# Patient Record
Sex: Male | Born: 1955 | Race: White | Hispanic: No | Marital: Married | Smoking: Current every day smoker
Health system: Southern US, Community
[De-identification: ages and names within clinical notes are randomized; demographics above are authoritative.]

## PROBLEM LIST (undated history)

## (undated) DIAGNOSIS — J45909 Unspecified asthma, uncomplicated: Secondary | ICD-10-CM

## (undated) DIAGNOSIS — I10 Essential (primary) hypertension: Secondary | ICD-10-CM

## (undated) DIAGNOSIS — J449 Chronic obstructive pulmonary disease, unspecified: Secondary | ICD-10-CM

---

## 2012-12-25 ENCOUNTER — Encounter (HOSPITAL_COMMUNITY): Payer: Self-pay | Admitting: *Deleted

## 2012-12-25 ENCOUNTER — Emergency Department (HOSPITAL_COMMUNITY): Payer: PRIVATE HEALTH INSURANCE

## 2012-12-25 ENCOUNTER — Emergency Department (HOSPITAL_COMMUNITY)
Admission: EM | Admit: 2012-12-25 | Discharge: 2012-12-25 | Disposition: A | Discharge status: Home / Self Care | Payer: PRIVATE HEALTH INSURANCE | Attending: Emergency Medicine | Admitting: Emergency Medicine

## 2012-12-25 DIAGNOSIS — R0682 Tachypnea, not elsewhere classified: Secondary | ICD-10-CM | POA: Insufficient documentation

## 2012-12-25 DIAGNOSIS — J45901 Unspecified asthma with (acute) exacerbation: Secondary | ICD-10-CM | POA: Insufficient documentation

## 2012-12-25 DIAGNOSIS — F17200 Nicotine dependence, unspecified, uncomplicated: Secondary | ICD-10-CM | POA: Insufficient documentation

## 2012-12-25 DIAGNOSIS — J4489 Other specified chronic obstructive pulmonary disease: Secondary | ICD-10-CM | POA: Insufficient documentation

## 2012-12-25 DIAGNOSIS — J449 Chronic obstructive pulmonary disease, unspecified: Secondary | ICD-10-CM | POA: Insufficient documentation

## 2012-12-25 DIAGNOSIS — J4521 Mild intermittent asthma with (acute) exacerbation: Secondary | ICD-10-CM

## 2012-12-25 DIAGNOSIS — I10 Essential (primary) hypertension: Secondary | ICD-10-CM | POA: Insufficient documentation

## 2012-12-25 DIAGNOSIS — R062 Wheezing: Secondary | ICD-10-CM | POA: Insufficient documentation

## 2012-12-25 HISTORY — DX: Essential (primary) hypertension: I10

## 2012-12-25 HISTORY — DX: Unspecified asthma, uncomplicated: J45.909

## 2012-12-25 HISTORY — DX: Chronic obstructive pulmonary disease, unspecified: J44.9

## 2012-12-25 LAB — TROPONIN I: Troponin I: <0.3 ng/mL (ref <0.30)

## 2012-12-25 LAB — BLOOD GAS, ARTERIAL
Acid-Base Excess: 1.4 mmol/L (ref 0.0–2.0)
Drawn by: 340271
FIO2: 0.28 %
pCO2 arterial: 40.8 mmHg (ref 35.0–45.0)
pH, Arterial: 7.413 (ref 7.350–7.450)
pO2, Arterial: 77.2 mmHg — ABNORMAL LOW (ref 80.0–100.0)

## 2012-12-25 LAB — BASIC METABOLIC PANEL
CO2: 25 mEq/L (ref 19–32)
Calcium: 9.3 mg/dL (ref 8.4–10.5)
Glucose, Bld: 103 mg/dL — ABNORMAL HIGH (ref 70–99)
Sodium: 136 mEq/L (ref 135–145)

## 2012-12-25 LAB — CBC
Hemoglobin: 15.7 g/dL (ref 13.0–17.0)
MCH: 31.8 pg (ref 26.0–34.0)
RBC: 4.93 MIL/uL (ref 4.22–5.81)

## 2012-12-25 MED ORDER — ALBUTEROL SULFATE HFA 108 (90 BASE) MCG/ACT IN AERS
2.0000 | INHALATION_SPRAY | RESPIRATORY_TRACT | Status: DC
  Administered 2012-12-25: 2 via RESPIRATORY_TRACT
  Filled 2012-12-25: qty 6.7

## 2012-12-25 MED ORDER — ALBUTEROL (5 MG/ML) CONTINUOUS INHALATION SOLN: 15.0000 mg | INHALATION_SOLUTION | Freq: Once | RESPIRATORY_TRACT | Status: AC

## 2012-12-25 MED ORDER — METHYLPREDNISOLONE SODIUM SUCC 125 MG IJ SOLR
125.0000 mg | Freq: Once | INTRAMUSCULAR | Status: AC
  Administered 2012-12-25: 125 mg via INTRAVENOUS
  Filled 2012-12-25: qty 2

## 2012-12-25 MED ORDER — ALBUTEROL SULFATE (5 MG/ML) 0.5% IN NEBU
INHALATION_SOLUTION | RESPIRATORY_TRACT | Status: AC
  Administered 2012-12-25: 5 mg via RESPIRATORY_TRACT
  Filled 2012-12-25: qty 1

## 2012-12-25 MED ORDER — IPRATROPIUM BROMIDE 0.02 % IN SOLN
0.5000 mg | Freq: Once | RESPIRATORY_TRACT | Status: AC
  Administered 2012-12-25: 0.5 mg via RESPIRATORY_TRACT

## 2012-12-25 MED ORDER — IPRATROPIUM BROMIDE 0.02 % IN SOLN
RESPIRATORY_TRACT | Status: AC
  Filled 2012-12-25: qty 2.5

## 2012-12-25 MED ORDER — ALBUTEROL SULFATE HFA 108 (90 BASE) MCG/ACT IN AERS: 2.0000 | INHALATION_SPRAY | RESPIRATORY_TRACT | Status: DC | PRN

## 2012-12-25 MED ORDER — PREDNISONE 10 MG PO TABS: 60.0000 mg | ORAL_TABLET | Freq: Every day | ORAL | Status: DC

## 2012-12-25 NOTE — ED Notes (Signed)
Pt states he has been breathing like this for an hour,  , incomplete sentences,  Pt states he is asthmatic and he hasn't been following up with MD and has fevers and out of inhaler

## 2012-12-25 NOTE — ED Notes (Signed)
Per wife at bedside, she states pt has had a change in mental status and has been vomiting this morning

## 2012-12-25 NOTE — ED Provider Notes (Signed)
CSN: [id number]     Arrival date & time 12/25/12  6213 History     First MD Initiated Contact with Patient 12/25/12 0710     Chief Complaint  Patient presents with  . Shortness of Breath    HPI Patient reports long-standing history of asthma.  He continues to smoke cigarettes.  He reports worsening shortness of breath her the past 24 hours with cough.  Denies fevers and chills.  No chest pain or chest tightness.  No abdominal pain.  No nausea vomiting or diarrhea.  Symptoms at this time are moderate to severe in severity.  He states he ran out of his albuterol and therefore his been unable to take any medications at home.  He is not currently on steroids.  He recently relocated to this area from [place].  He has no primary care physician in the area.  Nothing worsens the symptoms.  Nothing improves his symptoms.    Past Medical History  Diagnosis Date  . Asthma   . COPD (chronic obstructive pulmonary disease)   . Hypertension    No past surgical history on file. History reviewed. No pertinent family history. History  Substance Use Topics  . Smoking status: Heavy Tobacco Smoker -- 1.00 packs/day  . Smokeless tobacco: Not on file  . Alcohol Use: Yes     Comment: occassional    Review of Systems  All other systems reviewed and are negative.    Allergies  Review of patient's allergies indicates no known allergies.  Home Medications  No current outpatient prescriptions on file. BP 157/113  Pulse 74  Resp 24  SpO2 94% Physical Exam  Nursing note and vitals reviewed. Constitutional: He is oriented to person, place, and time. He appears well-developed and well-nourished.  HENT:  Head: Normocephalic and atraumatic.  Eyes: EOM are normal.  Neck: Normal range of motion.  Cardiovascular: Normal rate, regular rhythm, normal heart sounds and intact distal pulses.   Pulmonary/Chest: Tachypnea noted. No respiratory distress. He has wheezes.  Abdominal: Soft. He exhibits no  distension. There is no tenderness.  Genitourinary: Rectum normal.  Musculoskeletal: Normal range of motion. He exhibits no edema.  Neurological: He is alert and oriented to person, place, and time.  Skin: Skin is warm and dry.  Psychiatric: He has a normal mood and affect. Judgment normal.    ED Course   Procedures (including critical care time)  Labs Reviewed  BASIC METABOLIC PANEL - Abnormal; Notable for the following:    Glucose, Bld 103 (*)    All other components within normal limits  PRO B NATRIURETIC PEPTIDE - Abnormal; Notable for the following:    Pro B Natriuretic peptide (BNP) 151.2 (*)    All other components within normal limits  BLOOD GAS, ARTERIAL - Abnormal; Notable for the following:    pO2, Arterial 77.2 (*)    Bicarbonate 25.5 (*)    All other components within normal limits  CBC  TROPONIN I   Dg Chest Portable 1 View  12/25/2012   *RADIOLOGY REPORT*  Clinical Data: Smoker with current history of COPD presenting with acute shortness of breath.  PORTABLE CHEST - 1 VIEW 12/25/2012 0723 hours:  Comparison: None.  Findings: Cardiac silhouette upper normal in size for AP portable technique.  Thoracic aorta mildly tortuous and atherosclerotic. Hilar and mediastinal contours otherwise unremarkable. Emphysematous changes in the upper lobes with vascular redistribution to the mid and lower lungs.  Mild central peribronchial thickening.  No confluent airspace consolidation.  No  visible pleural effusions.  IMPRESSION: COPD/emphysema.  No acute cardiopulmonary disease.   Original Report Authenticated By: Hulan Saas, M.D.   I personally reviewed the imaging tests through PACS system I reviewed available ER/hospitalization records through the EMR   1. Asthma exacerbation attacks, mild intermittent     MDM  Asthma exacerbation.  Significant wheezing and some tachypnea at this time.  Will start on BiPAP and on a continuous albuterol treatment.  ABG pending.   8:15  AM No wheezing now. Feels much better. Home with steroids and albuterol for asthma exacerbation.  He made a very rapid improvement and therefore think that the majority of this is secondary to noncompliance with his albuterol and his continued smoking   Lyanne Co, MD 12/25/12 [phone]

## 2013-03-11 ENCOUNTER — Encounter (HOSPITAL_COMMUNITY): Payer: Self-pay | Admitting: Emergency Medicine

## 2013-03-11 ENCOUNTER — Emergency Department (HOSPITAL_COMMUNITY): Payer: PRIVATE HEALTH INSURANCE

## 2013-03-11 ENCOUNTER — Inpatient Hospital Stay (HOSPITAL_COMMUNITY)
Admission: EM | Admit: 2013-03-11 | Discharge: 2013-03-13 | DRG: 189 | Disposition: A | Discharge status: Home / Self Care | Payer: PRIVATE HEALTH INSURANCE | Attending: Internal Medicine | Admitting: Internal Medicine

## 2013-03-11 DIAGNOSIS — D72829 Elevated white blood cell count, unspecified: Secondary | ICD-10-CM | POA: Diagnosis present

## 2013-03-11 DIAGNOSIS — Z8249 Family history of ischemic heart disease and other diseases of the circulatory system: Secondary | ICD-10-CM

## 2013-03-11 DIAGNOSIS — F17200 Nicotine dependence, unspecified, uncomplicated: Secondary | ICD-10-CM | POA: Diagnosis present

## 2013-03-11 DIAGNOSIS — J441 Chronic obstructive pulmonary disease with (acute) exacerbation: Secondary | ICD-10-CM | POA: Diagnosis present

## 2013-03-11 DIAGNOSIS — I959 Hypotension, unspecified: Secondary | ICD-10-CM

## 2013-03-11 DIAGNOSIS — F17213 Nicotine dependence, cigarettes, with withdrawal: Secondary | ICD-10-CM

## 2013-03-11 DIAGNOSIS — J9600 Acute respiratory failure, unspecified whether with hypoxia or hypercapnia: Principal | ICD-10-CM | POA: Diagnosis present

## 2013-03-11 DIAGNOSIS — I10 Essential (primary) hypertension: Secondary | ICD-10-CM | POA: Diagnosis present

## 2013-03-11 DIAGNOSIS — E871 Hypo-osmolality and hyponatremia: Secondary | ICD-10-CM

## 2013-03-11 DIAGNOSIS — R509 Fever, unspecified: Secondary | ICD-10-CM | POA: Diagnosis present

## 2013-03-11 LAB — CBC WITH DIFFERENTIAL/PLATELET
Basophils Absolute: 0 10*3/uL (ref 0.0–0.1)
Basophils Relative: 0 % (ref 0–1)
Lymphocytes Relative: 10 % — ABNORMAL LOW (ref 12–46)
Neutro Abs: 12.2 10*3/uL — ABNORMAL HIGH (ref 1.7–7.7)
Neutrophils Relative %: 78 % — ABNORMAL HIGH (ref 43–77)
Platelets: 275 10*3/uL (ref 150–400)
RDW: 12.5 % (ref 11.5–15.5)
WBC: 15.5 10*3/uL — ABNORMAL HIGH (ref 4.0–10.5)

## 2013-03-11 LAB — BLOOD GAS, ARTERIAL
O2 Content: 3 L/min
O2 Saturation: 89.1 %
Patient temperature: 98.6
pCO2 arterial: 38.9 mmHg (ref 35.0–45.0)
pH, Arterial: 7.414 (ref 7.350–7.450)

## 2013-03-11 LAB — COMPREHENSIVE METABOLIC PANEL
ALT: 12 U/L (ref 0–53)
AST: 15 U/L (ref 0–37)
Albumin: 3.8 g/dL (ref 3.5–5.2)
CO2: 24 mEq/L (ref 19–32)
Calcium: 9.2 mg/dL (ref 8.4–10.5)
Chloride: 100 mEq/L (ref 96–112)
GFR calc non Af Amer: >90 mL/min (ref >90)
Sodium: 134 mEq/L — ABNORMAL LOW (ref 135–145)
Total Bilirubin: 0.7 mg/dL (ref 0.3–1.2)

## 2013-03-11 LAB — POCT I-STAT TROPONIN I: Troponin i, poc: 0 ng/mL (ref 0.00–0.08)

## 2013-03-11 MED ORDER — ALBUTEROL SULFATE (5 MG/ML) 0.5% IN NEBU
2.5000 mg | INHALATION_SOLUTION | RESPIRATORY_TRACT | Status: DC
  Administered 2013-03-11 – 2013-03-13 (×10): 2.5 mg via RESPIRATORY_TRACT
  Filled 2013-03-11 (×10): qty 0.5

## 2013-03-11 MED ORDER — IPRATROPIUM BROMIDE 0.02 % IN SOLN
0.5000 mg | RESPIRATORY_TRACT | Status: DC
  Administered 2013-03-11 – 2013-03-13 (×10): 0.5 mg via RESPIRATORY_TRACT
  Filled 2013-03-11 (×10): qty 2.5

## 2013-03-11 MED ORDER — ACETAMINOPHEN 650 MG RE SUPP: 650.0000 mg | Freq: Four times a day (QID) | RECTAL | Status: DC | PRN

## 2013-03-11 MED ORDER — ALBUTEROL SULFATE (5 MG/ML) 0.5% IN NEBU
INHALATION_SOLUTION | RESPIRATORY_TRACT | Status: AC
  Filled 2013-03-11: qty 0.5

## 2013-03-11 MED ORDER — HYDROCODONE-ACETAMINOPHEN 5-325 MG PO TABS: 1.0000 | ORAL_TABLET | ORAL | Status: DC | PRN

## 2013-03-11 MED ORDER — METHYLPREDNISOLONE SODIUM SUCC 125 MG IJ SOLR
80.0000 mg | Freq: Four times a day (QID) | INTRAMUSCULAR | Status: DC
  Administered 2013-03-11 – 2013-03-12 (×2): 80 mg via INTRAVENOUS
  Filled 2013-03-11: qty 1.28
  Filled 2013-03-11 (×2): qty 2
  Filled 2013-03-11 (×5): qty 1.28

## 2013-03-11 MED ORDER — SODIUM CHLORIDE 0.9 % IV SOLN
INTRAVENOUS | Status: DC
  Administered 2013-03-11: 23:00:00 via INTRAVENOUS

## 2013-03-11 MED ORDER — DEXTROSE 5 % IV SOLN
500.0000 mg | Freq: Once | INTRAVENOUS | Status: AC
  Administered 2013-03-11: 500 mg via INTRAVENOUS
  Filled 2013-03-11: qty 500

## 2013-03-11 MED ORDER — ONDANSETRON HCL 4 MG/2ML IJ SOLN
4.0000 mg | Freq: Once | INTRAMUSCULAR | Status: AC
  Administered 2013-03-11: 4 mg via INTRAVENOUS
  Filled 2013-03-11: qty 2

## 2013-03-11 MED ORDER — ENOXAPARIN SODIUM 40 MG/0.4ML ~~LOC~~ SOLN
40.0000 mg | SUBCUTANEOUS | Status: DC
Start: 2013-03-11 — End: 2013-03-12
  Administered 2013-03-11: 40 mg via SUBCUTANEOUS
  Filled 2013-03-11 (×2): qty 0.4

## 2013-03-11 MED ORDER — IPRATROPIUM BROMIDE 0.02 % IN SOLN
0.5000 mg | Freq: Once | RESPIRATORY_TRACT | Status: AC
  Administered 2013-03-11: 0.5 mg via RESPIRATORY_TRACT
  Filled 2013-03-11: qty 2.5

## 2013-03-11 MED ORDER — MORPHINE SULFATE 2 MG/ML IJ SOLN: 1.0000 mg | INTRAMUSCULAR | Status: DC | PRN

## 2013-03-11 MED ORDER — ACETAMINOPHEN 325 MG PO TABS: 650.0000 mg | ORAL_TABLET | Freq: Four times a day (QID) | ORAL | Status: DC | PRN

## 2013-03-11 MED ORDER — ALBUTEROL SULFATE (5 MG/ML) 0.5% IN NEBU
2.5000 mg | INHALATION_SOLUTION | Freq: Once | RESPIRATORY_TRACT | Status: AC
  Administered 2013-03-11: 2.5 mg via RESPIRATORY_TRACT
  Filled 2013-03-11: qty 0.5

## 2013-03-11 MED ORDER — IPRATROPIUM BROMIDE 0.02 % IN SOLN
0.5000 mg | Freq: Once | RESPIRATORY_TRACT | Status: AC
  Administered 2013-03-11: 0.5 mg via RESPIRATORY_TRACT

## 2013-03-11 MED ORDER — ALBUTEROL SULFATE (5 MG/ML) 0.5% IN NEBU: 2.5000 mg | INHALATION_SOLUTION | RESPIRATORY_TRACT | Status: DC

## 2013-03-11 MED ORDER — DEXTROSE 5 % IV SOLN
1.0000 g | Freq: Once | INTRAVENOUS | Status: AC
  Administered 2013-03-11: 1 g via INTRAVENOUS
  Filled 2013-03-11: qty 10

## 2013-03-11 MED ORDER — LEVOFLOXACIN IN D5W 500 MG/100ML IV SOLN
500.0000 mg | INTRAVENOUS | Status: DC
  Administered 2013-03-12 – 2013-03-13 (×2): 500 mg via INTRAVENOUS
  Filled 2013-03-11 (×4): qty 100

## 2013-03-11 MED ORDER — ALBUTEROL SULFATE (5 MG/ML) 0.5% IN NEBU: 2.5000 mg | INHALATION_SOLUTION | RESPIRATORY_TRACT | Status: DC | PRN

## 2013-03-11 MED ORDER — ALBUTEROL SULFATE (5 MG/ML) 0.5% IN NEBU
2.5000 mg | INHALATION_SOLUTION | Freq: Once | RESPIRATORY_TRACT | Status: AC
  Administered 2013-03-11: 2.5 mg via RESPIRATORY_TRACT

## 2013-03-11 MED ORDER — GUAIFENESIN ER 600 MG PO TB12
600.0000 mg | ORAL_TABLET | Freq: Two times a day (BID) | ORAL | Status: DC
  Administered 2013-03-11 – 2013-03-13 (×4): 600 mg via ORAL
  Filled 2013-03-11 (×5): qty 1

## 2013-03-11 MED ORDER — IOHEXOL 300 MG/ML  SOLN
80.0000 mL | Freq: Once | INTRAMUSCULAR | Status: AC | PRN
  Administered 2013-03-11: 80 mL via INTRAVENOUS

## 2013-03-11 MED ORDER — TRAMADOL HCL 50 MG PO TABS
50.0000 mg | ORAL_TABLET | Freq: Once | ORAL | Status: AC
  Administered 2013-03-11: 50 mg via ORAL
  Filled 2013-03-11: qty 1

## 2013-03-11 NOTE — ED Notes (Signed)
RT called for abg.

## 2013-03-11 NOTE — Progress Notes (Signed)
Patient confirms he does not have a pcp.  Patient reports he has just moved to [place] from [place].  His pcp in [place] is Dr. Lucy Chris.  Patient is currently looking for a pcp.  Instructed patient to call the phone number on the back of his insurance card to help him find a physician who is close to him and within network.  Patient verbalized understanding.

## 2013-03-11 NOTE — ED Provider Notes (Signed)
CSN: [id number]     Arrival date & time 03/11/13  1608 History   First MD Initiated Contact with Patient 03/11/13 1621     Chief Complaint  Patient presents with  . Chest Pain  . 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  He reports that this "knocked the breath out of me" but did not loose consciousness.  He reports pain with now increase in shortness of breath.  Reports that he has a history of COPD but states that over the past several days his shortness of breath is worse.  Reports productive cough with while sputum.  States fever to 104 today and generalized body aches.  States moved here from [place] 6 months ago and has not seen a PCP here or pulmonologist.  Reports radiation of the pain to his back.    Patient is a 57 y.o. male presenting with chest pain. The history is provided by the patient and the spouse. No language interpreter was used.  Chest Pain Pain location:  L lateral chest Pain quality: dull and radiating   Pain radiates to:  Upper back Pain radiates to the back: yes   Pain severity:  Moderate Onset quality:  Gradual Duration:  7 days Timing:  Constant Progression:  Worsening Chronicity:  New Context: breathing, movement and trauma   Relieved by:  Nothing Worsened by:  Nothing tried Ineffective treatments:  None tried Associated symptoms: back pain, cough, fever and shortness of breath   Associated symptoms: no abdominal pain, no anorexia, no dizziness, no dysphagia, no headache, no lower extremity edema, no nausea, no numbness, no palpitations, no syncope, not vomiting and no weakness   Risk factors: hypertension and smoking     Past Medical History  Diagnosis Date  . Asthma   . COPD (chronic obstructive pulmonary disease)   . Hypertension      History reviewed. No pertinent past surgical history. No family history on file. History  Substance Use Topics  . Smoking status: Heavy Tobacco Smoker -- 1.00 packs/day  . Smokeless tobacco: Not on file  . Alcohol Use: Yes     Comment: occassional    Review of Systems  Constitutional: Positive for fever.  HENT: Negative for trouble swallowing.   Respiratory: Positive for cough and shortness of breath.   Cardiovascular: Positive for chest pain. Negative for palpitations and syncope.  Gastrointestinal: Negative for nausea, vomiting, abdominal pain and anorexia.  Musculoskeletal: Positive for back pain.  Neurological: Negative for dizziness, weakness, numbness and headaches.  All other systems reviewed and are negative.    Allergies  Review of patient's allergies indicates no known allergies.  Home Medications   Current Outpatient Rx  Name  Route  Sig  Dispense  Refill  . acetaminophen (TYLENOL) 500 MG tablet   Oral   Take 500 mg by mouth every 6 (six) hours as needed for pain (pain).         Marland Kitchen albuterol (PROVENTIL HFA;VENTOLIN HFA) 108 (90 BASE) MCG/ACT inhaler   Inhalation   Inhale 2 puffs into the lungs every 4 (four) hours as needed for wheezing.   1 Inhaler   0   . albuterol (PROVENTIL HFA;VENTOLIN HFA) 108 (90 BASE) MCG/ACT inhaler   Inhalation   Inhale 2 puffs into the lungs every 6 (six) hours as needed for  wheezing (pt uses wife inhaler).         . predniSONE (DELTASONE) 10 MG tablet   Oral   Take 6 tablets (60 mg total) by mouth daily.   30 tablet   0    BP 131/65  Pulse 105  Temp(Src) 98.7 F (37.1 C) (Oral)  Resp 24  SpO2 90% Physical Exam  Nursing note and vitals reviewed. Constitutional: He is oriented to person, place, and time. He appears well-developed and well-nourished.  febrile  HENT:  Head: Normocephalic and atraumatic.  Right Ear: External ear normal.  Left Ear: External ear normal.  Nose: Nose normal.  Mouth/Throat:  Oropharynx is clear and moist. No oropharyngeal exudate.  Eyes: Conjunctivae are normal. Pupils are equal, round, and reactive to light. No scleral icterus.  Neck: Normal range of motion. Neck supple.  Cardiovascular: Normal rate, regular rhythm, normal heart sounds and intact distal pulses.  Exam reveals no gallop and no friction rub.   No murmur heard. Pulmonary/Chest: Tachypnea noted. No respiratory distress. He has no decreased breath sounds. He has wheezes in the right lower field, the left middle field and the left lower field. He has rales in the right lower field, the left middle field and the left lower field. He exhibits tenderness.  Abdominal: Soft. Bowel sounds are normal. He exhibits no distension. There is no tenderness.  Musculoskeletal: Normal range of motion. He exhibits no edema.       Thoracic back: He exhibits tenderness. He exhibits no bony tenderness.       Back:  Lymphadenopathy:    He has no cervical adenopathy.  Neurological: He is alert and oriented to person, place, and time. No cranial nerve deficit. He exhibits normal muscle tone. Coordination normal.  Skin: Skin is warm and dry. No rash noted. No erythema. No pallor.  Psychiatric: He has a normal mood and affect. His behavior is normal. Judgment and thought content normal.    ED Course  Procedures (including critical care time) Labs Review Labs Reviewed  CBC WITH DIFFERENTIAL  COMPREHENSIVE METABOLIC PANEL   Imaging Review No results found.  EKG Interpretation   None      Date: 03/11/2013  Rate: 100  Rhythm: sinus tachycardia  QRS Axis: normal  Intervals: left atrial enlargement  ST/T Wave abnormalities: normal  Conduction Disutrbances:none  Narrative Interpretation: Reviewed by Dr. Effie Shy  Old EKG Reviewed: None available  Results for orders placed during the hospital encounter of 03/11/13  CBC WITH DIFFERENTIAL      Result Value Range   WBC 15.5 (*) 4.0 - 10.5 K/uL   RBC 4.48  4.22 - 5.81  MIL/uL   Hemoglobin 14.3  13.0 - 17.0 g/dL   HCT 16.1  09.6 - 04.5 %   MCV 90.4  78.0 - 100.0 fL   MCH 31.9  26.0 - 34.0 pg   MCHC 35.3  30.0 - 36.0 g/dL   RDW 40.9  81.1 - 91.4 %   Platelets 275  150 - 400 K/uL   Neutrophils Relative % 78 (*) 43 - 77 %   Neutro Abs 12.2 (*) 1.7 - 7.7 K/uL   Lymphocytes Relative 10 (*) 12 - 46 %   Lymphs Abs 1.5  0.7 - 4.0 K/uL   Monocytes Relative 9  3 - 12 %   Monocytes Absolute 1.4 (*) 0.1 - 1.0 K/uL   Eosinophils Relative 3  0 - 5 %   Eosinophils Absolute 0.4  0.0 - 0.7 K/uL  Basophils Relative 0  0 - 1 %   Basophils Absolute 0.0  0.0 - 0.1 K/uL  COMPREHENSIVE METABOLIC PANEL      Result Value Range   Sodium 134 (*) 135 - 145 mEq/L   Potassium 3.8  3.5 - 5.1 mEq/L   Chloride 100  96 - 112 mEq/L   CO2 24  19 - 32 mEq/L   Glucose, Bld 118 (*) 70 - 99 mg/dL   BUN 13  6 - 23 mg/dL   Creatinine, Ser 4.09  0.50 - 1.35 mg/dL   Calcium 9.2  8.4 - 81.1 mg/dL   Total Protein 7.0  6.0 - 8.3 g/dL   Albumin 3.8  3.5 - 5.2 g/dL   AST 15  0 - 37 U/L   ALT 12  0 - 53 U/L   Alkaline Phosphatase 92  39 - 117 U/L   Total Bilirubin 0.7  0.3 - 1.2 mg/dL   GFR calc non Af Amer >90  >90 mL/min   GFR calc Af Amer >90  >90 mL/min  POCT I-STAT TROPONIN I      Result Value Range   Troponin i, poc 0.00  0.00 - 0.08 ng/mL   Comment 3            Dg Chest 2 View  03/11/2013   CLINICAL DATA:  Chest pain. COPD.  EXAM: CHEST  2 VIEW  COMPARISON:  CHEST x-ray 12/25/2012.  FINDINGS: Mild diffuse peribronchial cuffing, similar to the prior study. Lung volumes are normal. No consolidative airspace disease. No pleural effusions. No pneumothorax. No pulmonary nodule or mass noted. Pulmonary vasculature and the cardiomediastinal silhouette are within normal limits.  IMPRESSION: * No radiographic evidence of acute cardiopulmonary disease. *Mild diffuse peribronchial cuffing may indicate mild chronic bronchitis, and is similar to prior to prior study.   Electronically Signed    By: Trudie Reed M.D.   On: 03/11/2013 18:05   Ct Chest W Contrast  03/11/2013   CLINICAL DATA:  History of trauma after falling off a truck. Shortness of breath and intermittent chest pain.  EXAM: CT CHEST WITH CONTRAST  TECHNIQUE: Multidetector CT imaging of the chest was performed during intravenous contrast administration.  CONTRAST:  80mL OMNIPAQUE IOHEXOL 300 MG/ML  SOLN  COMPARISON:  No priors.  FINDINGS: Mediastinum: Heart size is normal. There is no significant pericardial fluid, thickening or pericardial calcification. There is atherosclerosis of the thoracic aorta, the great vessels of the mediastinum and the coronary arteries, including calcified atherosclerotic plaque in the left main, left anterior descending, left circumflex and right coronary arteries. Numerous borderline enlarged mediastinal and bilateral hilar lymph nodes are nonspecific and may be reactive. No definite pathologically enlarged lymph nodes are identified. Esophagus is unremarkable in appearance.  Lungs/Pleura: Widespread bronchial wall thickening is noted. There is some associated thickening of the peribronchovascular interstitium throughout the lung bases bilaterally with some very tiny peribronchovascular micronodularity in the lower lobes. No confluent consolidative airspace disease. No pleural effusions. Mild centrilobular emphysema. No definite large suspicious appearing pulmonary nodules or masses are identified. Evaluation of the lung bases is significantly limited by a large amount of respiratory motion.  Upper Abdomen: Unremarkable.  Musculoskeletal: No acute displaced fractures in the visualized portions of the skeleton. There are no aggressive appearing lytic or blastic lesions noted in the visualized portions of the skeleton.  IMPRESSION: 1. No evidence to suggest significant acute traumatic injury to the thorax. The appearance of the chest suggests bronchitis, as above. Although  some of this may be chronic,  particularly based on comparison with prior chest x-rays, the appearance on today's examination suggests an element of acute bronchitis as well. 2. Atherosclerosis, including left main and 3 vessel coronary artery disease. Please note that although the presence of coronary artery calcium documents the presence of coronary artery disease, the severity of this disease and any potential stenosis cannot be assessed on this non-gated CT examination. Assessment for potential risk factor modification, dietary therapy or pharmacologic therapy may be warranted, if clinically indicated. 3. Borderline enlarged mediastinal lymph nodes are presumably reactive in the setting of underlying infection.   Electronically Signed   By: Trudie Reed M.D.   On: 03/11/2013 20:02      MDM  COPD exacerbation  Patient is here with intermittent chest pain and shortness of breath s/p fall, though this likely started the pain, I do not suspect this to be cardiac or trauma in nature.  He has a history of COPD but is not on oxygen, here with fever and hypoxia to 88% on room air.  There is no evidence of PNA on x-ray or CT Scan but clinically I suspect there to be an infectious component.  I have started the patient on rocephin and zithromax.  He remains stable on 2 liters of oxygen with sats at 94%.  He has had two breathing treatments which have opened him up more now with bilateral expiratory wheezing noted.  I have spoken with Dr. Ranae Palms and we will admit him to tele.     Izola Price Marisue Humble, PA-C 03/11/13 2050

## 2013-03-11 NOTE — H&P (Signed)
[facility] History and Physical  Roger Fields ZOX:[id number] DOB: 10/07/1955 DOA: 03/11/2013  Referring physician: Dr Loma Messing.  PCP: No primary provider on file.  Specialists: none  Chief Complaint: worsening SOB.   HPI: Roger Fields is a 57 y.o. male with PMH significant for COPD, current smoker 1 PPD, Hypertension, he moved from [place] this year. He doesn't have a PCP. He presents to ED complaining of worsening SOB for last 3 to 4 days. Worsening cough. Reports fever at 104 at home. He has not notice improvement of SOB with nebulizer treatments. He fell off his truck and landed on his left lateral back days ago.  He denies chest pain at this time. He is breathing better.  He is sleepy but answer questions appropriately. He received tramadol in the ED. He relates he has not been able to sleep for last 3 days. ABG is pending.    Review of Systems: Negative except as per HPI.   Past Medical History  Diagnosis Date  . Asthma   . COPD (chronic obstructive pulmonary disease)   . Hypertension    History reviewed. No pertinent past surgical history. Social History:  reports that he has been smoking.  He does not have any smokeless tobacco history on file. He reports that he drinks alcohol. He reports that he does not use illicit drugs. smoke 1 pack per day. He works for [employer].    No Known Allergies  Family History: History of heart diseases and HTN.   Prior to Admission medications   Medication Sig Start Date End Date Taking? Authorizing Provider  albuterol (PROVENTIL HFA;VENTOLIN HFA) 108 (90 BASE) MCG/ACT inhaler Inhale 2 puffs into the lungs every 4 (four) hours as needed for wheezing. 12/25/12  Yes Lyanne Co, MD  Aspirin-Salicylamide-Caffeine [facility] HEADACHE POWDER PO) Take 1 each by mouth daily as needed (pain).   Yes Historical Provider, MD  ibuprofen (ADVIL,MOTRIN) 200 MG tablet Take 400 mg by mouth every 6 (six) hours as needed for pain.   Yes  Historical Provider, MD  lisinopril (PRINIVIL,ZESTRIL) 10 MG tablet Take 10 mg by mouth daily.   Yes Historical Provider, MD   Physical Exam: Filed Vitals:   03/11/13 1925  BP: 98/47  Pulse: 91  Temp: 97.9 F (36.6 C)  Resp: 18   General Appearance:    Sleepy, but wake up and answer questions,  cooperative, no distress, appears stated age  Head:    Normocephalic, without obvious abnormality, atraumatic  Eyes:    PERRL, conjunctiva/corneas clear, EOM's intact,         Ears:    Normal TM's and external ear canals, both ears  Nose:   Nares normal, septum midline, mucosa normal, no drainage    or sinus tenderness  Throat:   Lips, mucosa normal , and tongue with nodule old, ; teeth and gums normal  Neck:   Supple, symmetrical, trachea midline, no adenopathy;       thyroid:  No enlargement/tenderness/nodules; no carotid   bruit or JVD  Back:     Symmetric, no curvature, ROM normal, no CVA tenderness  Lungs:     Bilateral expiratory wheezes, respirations unlabored  Chest wall:    No tenderness or deformity  Heart:    Regular rate and rhythm, S1 and S2 normal, no murmur, rub   or gallop  Abdomen:     Soft, non-tender, bowel sounds active all four quadrants,    no masses, no organomegaly  Extremities:   Extremities normal, atraumatic, no cyanosis or edema  Pulses:   2+ and symmetric all extremities  Skin:   Skin color, texture, turgor normal, no rashes or lesions  Lymph nodes:   Cervical, supraclavicular, and axillary nodes normal  Neurologic:   CNII-XII intact. Normal strength, sensation and reflexes      throughout      Labs on Admission:  Basic Metabolic Panel:  Recent Labs Lab 03/11/13 1630  NA 134*  K 3.8  CL 100  CO2 24  GLUCOSE 118*  BUN 13  CREATININE 0.84  CALCIUM 9.2   Liver Function Tests:  Recent Labs Lab 03/11/13 1630  AST 15  ALT 12  ALKPHOS 92  BILITOT 0.7  PROT 7.0  ALBUMIN 3.8   No results found for this basename: LIPASE, AMYLASE,  in  the last 168 hours No results found for this basename: AMMONIA,  in the last 168 hours CBC:  Recent Labs Lab 03/11/13 1630  WBC 15.5*  NEUTROABS 12.2*  HGB 14.3  HCT 40.5  MCV 90.4  PLT 275   Cardiac Enzymes: No results found for this basename: CKTOTAL, CKMB, CKMBINDEX, TROPONINI,  in the last 168 hours  BNP (last 3 results)  Recent Labs  12/25/12 0715  PROBNP 151.2*   CBG: No results found for this basename: GLUCAP,  in the last 168 hours  Radiological Exams on Admission: Dg Chest 2 View  03/11/2013   CLINICAL DATA:  Chest pain. COPD.  EXAM: CHEST  2 VIEW  COMPARISON:  CHEST x-ray 12/25/2012.  FINDINGS: Mild diffuse peribronchial cuffing, similar to the prior study. Lung volumes are normal. No consolidative airspace disease. No pleural effusions. No pneumothorax. No pulmonary nodule or mass noted. Pulmonary vasculature and the cardiomediastinal silhouette are within normal limits.  IMPRESSION: * No radiographic evidence of acute cardiopulmonary disease. *Mild diffuse peribronchial cuffing may indicate mild chronic bronchitis, and is similar to prior to prior study.   Electronically Signed   By: Trudie Reed M.D.   On: 03/11/2013 18:05   Ct Chest W Contrast  03/11/2013   CLINICAL DATA:  History of trauma after falling off a truck. Shortness of breath and intermittent chest pain.  EXAM: CT CHEST WITH CONTRAST  TECHNIQUE: Multidetector CT imaging of the chest was performed during intravenous contrast administration.  CONTRAST:  80mL OMNIPAQUE IOHEXOL 300 MG/ML  SOLN  COMPARISON:  No priors.  FINDINGS: Mediastinum: Heart size is normal. There is no significant pericardial fluid, thickening or pericardial calcification. There is atherosclerosis of the thoracic aorta, the great vessels of the mediastinum and the coronary arteries, including calcified atherosclerotic plaque in the left main, left anterior descending, left circumflex and right coronary arteries. Numerous borderline  enlarged mediastinal and bilateral hilar lymph nodes are nonspecific and may be reactive. No definite pathologically enlarged lymph nodes are identified. Esophagus is unremarkable in appearance.  Lungs/Pleura: Widespread bronchial wall thickening is noted. There is some associated thickening of the peribronchovascular interstitium throughout the lung bases bilaterally with some very tiny peribronchovascular micronodularity in the lower lobes. No confluent consolidative airspace disease. No pleural effusions. Mild centrilobular emphysema. No definite large suspicious appearing pulmonary nodules or masses are identified. Evaluation of the lung bases is significantly limited by a large amount of respiratory motion.  Upper Abdomen: Unremarkable.  Musculoskeletal: No acute displaced fractures in the visualized portions of the skeleton. There are no aggressive appearing lytic or blastic lesions noted in the visualized portions of the skeleton.  IMPRESSION: 1. No  evidence to suggest significant acute traumatic injury to the thorax. The appearance of the chest suggests bronchitis, as above. Although some of this may be chronic, particularly based on comparison with prior chest x-rays, the appearance on today's examination suggests an element of acute bronchitis as well. 2. Atherosclerosis, including left main and 3 vessel coronary artery disease. Please note that although the presence of coronary artery calcium documents the presence of coronary artery disease, the severity of this disease and any potential stenosis cannot be assessed on this non-gated CT examination. Assessment for potential risk factor modification, dietary therapy or pharmacologic therapy may be warranted, if clinically indicated. 3. Borderline enlarged mediastinal lymph nodes are presumably reactive in the setting of underlying infection.   Electronically Signed   By: Trudie Reed M.D.   On: 03/11/2013 20:02    EKG: pending.    Assessment/Plan Active Problems:   COPD with exacerbation   Acute respiratory failure   Leukocytosis  1-Acute hypoxic respiratory failure: likely secondary to Acute COPD exacerbation in a current smoker patient. Patient sat 90 on 3 L. Will admit to step down units. I will start IV solumedrol. He received one dose of ceftriaxone and azithro. I will continue with Levaquin due to history of fever. ABG pending. Will order schedule albuterol and ipratropium.   2-Leukocytosis: with history of fever. I will continue with Levaquin. CT chest negative for PNA. Check UA. SBP soft ,will check lactic acid.   3-Hypertension: will hold lisinopril, SBP soft.   4-Borderline enlarged mediastinal lymph nodes are presumably  reactive in the setting of underlying infection.   Code Status: Full Code.  Family Communication: Care discussed with wife.  Disposition Plan: expect 2 to 3 days inpatient.   Time spent: 75 minutes.   Kahleel Fadeley [facility] Pager [phone]  If 7PM-7AM, please contact night-coverage [email or web address] Password [email or web address] 03/11/2013, 9:29 PM

## 2013-03-11 NOTE — ED Provider Notes (Signed)
Medical screening examination/treatment/procedure(s) were performed by non-physician practitioner and as supervising physician I was immediately available for consultation/collaboration.  Flint Melter, MD 03/11/13 2215

## 2013-03-11 NOTE — ED Notes (Signed)
Pt was at work and feel approx 5 ft off truck sob since then intermit chest pain, elveated bp hx of this and has been on medication for this. Hx copd.

## 2013-03-12 ENCOUNTER — Encounter (HOSPITAL_COMMUNITY): Payer: Self-pay | Admitting: *Deleted

## 2013-03-12 DIAGNOSIS — I959 Hypotension, unspecified: Secondary | ICD-10-CM

## 2013-03-12 DIAGNOSIS — I10 Essential (primary) hypertension: Secondary | ICD-10-CM

## 2013-03-12 DIAGNOSIS — E871 Hypo-osmolality and hyponatremia: Secondary | ICD-10-CM

## 2013-03-12 DIAGNOSIS — F17213 Nicotine dependence, cigarettes, with withdrawal: Secondary | ICD-10-CM

## 2013-03-12 LAB — BASIC METABOLIC PANEL
BUN: 13 mg/dL (ref 6–23)
Creatinine, Ser: 0.89 mg/dL (ref 0.50–1.35)
GFR calc Af Amer: >90 mL/min (ref >90)
GFR calc non Af Amer: >90 mL/min (ref >90)
Glucose, Bld: 164 mg/dL — ABNORMAL HIGH (ref 70–99)
Potassium: 4.1 mEq/L (ref 3.5–5.1)
Sodium: 133 mEq/L — ABNORMAL LOW (ref 135–145)

## 2013-03-12 LAB — CBC
Hemoglobin: 13.6 g/dL (ref 13.0–17.0)
MCH: 31.1 pg (ref 26.0–34.0)
MCHC: 34.2 g/dL (ref 30.0–36.0)
Platelets: 250 10*3/uL (ref 150–400)
RBC: 4.37 MIL/uL (ref 4.22–5.81)
RDW: 12.7 % (ref 11.5–15.5)

## 2013-03-12 LAB — URINALYSIS, ROUTINE W REFLEX MICROSCOPIC
Bilirubin Urine: NEGATIVE
Hgb urine dipstick: NEGATIVE
Ketones, ur: NEGATIVE mg/dL
Nitrite: NEGATIVE
Protein, ur: NEGATIVE mg/dL
Specific Gravity, Urine: 1.023 (ref 1.005–1.030)
Urobilinogen, UA: 1 mg/dL (ref 0.0–1.0)

## 2013-03-12 LAB — MRSA PCR SCREENING: MRSA by PCR: NEGATIVE

## 2013-03-12 MED ORDER — PNEUMOCOCCAL VAC POLYVALENT 25 MCG/0.5ML IJ INJ
0.5000 mL | INJECTION | INTRAMUSCULAR | Status: AC
  Administered 2013-03-13: 0.5 mL via INTRAMUSCULAR
  Filled 2013-03-12 (×2): qty 0.5

## 2013-03-12 MED ORDER — ENOXAPARIN SODIUM 60 MG/0.6ML ~~LOC~~ SOLN
50.0000 mg | SUBCUTANEOUS | Status: DC
  Administered 2013-03-12: 50 mg via SUBCUTANEOUS
  Filled 2013-03-12 (×2): qty 0.6

## 2013-03-12 MED ORDER — INFLUENZA VAC SPLIT QUAD 0.5 ML IM SUSP
0.5000 mL | INTRAMUSCULAR | Status: AC
  Administered 2013-03-13: 09:00:00 0.5 mL via INTRAMUSCULAR
  Filled 2013-03-12 (×2): qty 0.5

## 2013-03-12 MED ORDER — TRAMADOL HCL 50 MG PO TABS: 50.0000 mg | ORAL_TABLET | Freq: Four times a day (QID) | ORAL | Status: DC | PRN

## 2013-03-12 MED ORDER — NICOTINE 21 MG/24HR TD PT24
21.0000 mg | MEDICATED_PATCH | Freq: Every day | TRANSDERMAL | Status: DC
  Administered 2013-03-12 – 2013-03-13 (×2): 21 mg via TRANSDERMAL
  Filled 2013-03-12 (×2): qty 1

## 2013-03-12 MED ORDER — METHYLPREDNISOLONE SODIUM SUCC 125 MG IJ SOLR
80.0000 mg | Freq: Two times a day (BID) | INTRAMUSCULAR | Status: AC
  Administered 2013-03-12 – 2013-03-13 (×3): 80 mg via INTRAVENOUS
  Filled 2013-03-12 (×4): qty 1.28

## 2013-03-12 NOTE — Progress Notes (Signed)
Report called to Connye Burkitt, RN.  Patient to transfer via wheelchair to room 1444.  Wife at bedside. Unable to titrate O2 off.  O2 sats drop below 90% on room air.

## 2013-03-12 NOTE — Progress Notes (Signed)
PHARMACY - LOVENOX  Pharmacy asked to adjust Lovenox dose as necessary.  57 yr male with total weight = 102.2 KG, Scr = 0.84 (CrCl ~ 121 ml/min) and BMI = 30.5  As BMI > 30, will adjust Lovenox to 0.5 mg/kg/q24h.  PLAN:  Change Lovenox to 50mg  sq q24h  Terrilee Files, PharmD 03/12/13

## 2013-03-12 NOTE — Progress Notes (Addendum)
[facility] PROGRESS NOTE  Roger Fields WUJ:[id number] DOB: 02-Sep-1955 DOA: 03/11/2013 PCP: No primary provider on file.  Assessment/Plan  Acute hypoxic respiratory failure likely secondary to Acute COPD exacerbation in a current smoker patient. -  Wean oxygen as tolerated -  Change solumedrol to BID -  Continue q4h duonebs -  Continue levofloxacin -  Okay for patient to receive his vaccinations  Leukocytosis: with history of fever.  May have bronchitis, but no PNA on CT.   -  UA pending -  Will likely rise in the setting of steroids  Hypertension with borderline hypotension -  Continue to hold lisinopril, -  Encouraged eating and drinking -  D/C IVF  Borderline enlarged mediastinal lymph nodes are presumably reactive in the setting of underlying infection. -  May need follow up CT to evaluate lymphadenopathy in a few weeks  Cigarette nicotine dependence with withdrawal -  Counseled cessation -  Nursing staff to provide more education -  Nicotine patch   Diet:  Healthy heart Access:  PIV IVF:  off Proph:  lovenox  Code Status: full Family Communication: patient and wife Disposition Plan: transfer to telemetry   Consultants:  None  Procedures:  CT angio chest 10/10  Antibiotics:  Ceftriaxone and azithromycin x 1 on 10/10  Levofloxacin 10/10 >>   HPI/Subjective:  Patient states that he is breathing much easier today.  Mild cough, but chest still feels tight and wheezy.  No recent fevers.  Hungry and ready to eat breakfast.  Feels bowels will move this AM  Objective: Filed Vitals:   03/12/13 0000 03/12/13 0200 03/12/13 0400 03/12/13 0546  BP:  107/56 109/55   Pulse: 81 63 57   Temp: 99.1 F (37.3 C)  96.9 F (36.1 C)   TempSrc: Oral  Oral   Resp: 21 22 18    Height:      Weight:      SpO2: 93% 92% 95% 93%    Intake/Output Summary (Last 24 hours) at 03/12/13 0733 Last data filed at 03/12/13 0645  Gross per 24 hour  Intake 613.75 ml   Output    600 ml  Net  13.75 ml   Filed Weights   03/11/13 2235  Weight: 102.2 kg (225 lb 5 oz)    Exam:   General:  CM, No acute distress, nasal canula in place  HEENT:  NCAT, MMM  Cardiovascular:  RRR, nl S1, S2 no mrg, 2+ pulses, warm extremities  Respiratory:  Diminished bilateral breath sounds with full expiratory wheeze and prolonged expiratory phase, no rhonchi or focal rales, no increased WOB at rest  Abdomen:   NABS, soft, NT/ND  MSK:   Normal tone and bulk, no LEE  Neuro:  Grossly intact  Data Reviewed: Basic Metabolic Panel:  Recent Labs Lab 03/11/13 1630 03/12/13 0345  NA 134* 133*  K 3.8 4.1  CL 100 100  CO2 24 25  GLUCOSE 118* 164*  BUN 13 13  CREATININE 0.84 0.89  CALCIUM 9.2 9.0   Liver Function Tests:  Recent Labs Lab 03/11/13 1630  AST 15  ALT 12  ALKPHOS 92  BILITOT 0.7  PROT 7.0  ALBUMIN 3.8   No results found for this basename: LIPASE, AMYLASE,  in the last 168 hours No results found for this basename: AMMONIA,  in the last 168 hours CBC:  Recent Labs Lab 03/11/13 1630 03/12/13 0345  WBC 15.5* 18.5*  NEUTROABS 12.2*  --   HGB 14.3 13.6  HCT 40.5  39.8  MCV 90.4 91.1  PLT 275 250   Cardiac Enzymes: No results found for this basename: CKTOTAL, CKMB, CKMBINDEX, TROPONINI,  in the last 168 hours BNP (last 3 results)  Recent Labs  12/25/12 0715  PROBNP 151.2*   CBG: No results found for this basename: GLUCAP,  in the last 168 hours  Recent Results (from the past 240 hour(s))  MRSA PCR SCREENING     Status: None   Collection Time    03/11/13 10:55 PM      Result Value Range Status   MRSA by PCR NEGATIVE  NEGATIVE Final   Comment:            The GeneXpert MRSA Assay (FDA     approved for NASAL specimens     only), is one component of a     comprehensive MRSA colonization     surveillance program. It is not     intended to diagnose MRSA     infection nor to guide or     monitor treatment for     MRSA  infections.     Studies: Dg Chest 2 View  03/11/2013   CLINICAL DATA:  Chest pain. COPD.  EXAM: CHEST  2 VIEW  COMPARISON:  CHEST x-ray 12/25/2012.  FINDINGS: Mild diffuse peribronchial cuffing, similar to the prior study. Lung volumes are normal. No consolidative airspace disease. No pleural effusions. No pneumothorax. No pulmonary nodule or mass noted. Pulmonary vasculature and the cardiomediastinal silhouette are within normal limits.  IMPRESSION: * No radiographic evidence of acute cardiopulmonary disease. *Mild diffuse peribronchial cuffing may indicate mild chronic bronchitis, and is similar to prior to prior study.   Electronically Signed   By: Trudie Reed M.D.   On: 03/11/2013 18:05   Ct Chest W Contrast  03/11/2013   CLINICAL DATA:  History of trauma after falling off a truck. Shortness of breath and intermittent chest pain.  EXAM: CT CHEST WITH CONTRAST  TECHNIQUE: Multidetector CT imaging of the chest was performed during intravenous contrast administration.  CONTRAST:  80mL OMNIPAQUE IOHEXOL 300 MG/ML  SOLN  COMPARISON:  No priors.  FINDINGS: Mediastinum: Heart size is normal. There is no significant pericardial fluid, thickening or pericardial calcification. There is atherosclerosis of the thoracic aorta, the great vessels of the mediastinum and the coronary arteries, including calcified atherosclerotic plaque in the left main, left anterior descending, left circumflex and right coronary arteries. Numerous borderline enlarged mediastinal and bilateral hilar lymph nodes are nonspecific and may be reactive. No definite pathologically enlarged lymph nodes are identified. Esophagus is unremarkable in appearance.  Lungs/Pleura: Widespread bronchial wall thickening is noted. There is some associated thickening of the peribronchovascular interstitium throughout the lung bases bilaterally with some very tiny peribronchovascular micronodularity in the lower lobes. No confluent consolidative  airspace disease. No pleural effusions. Mild centrilobular emphysema. No definite large suspicious appearing pulmonary nodules or masses are identified. Evaluation of the lung bases is significantly limited by a large amount of respiratory motion.  Upper Abdomen: Unremarkable.  Musculoskeletal: No acute displaced fractures in the visualized portions of the skeleton. There are no aggressive appearing lytic or blastic lesions noted in the visualized portions of the skeleton.  IMPRESSION: 1. No evidence to suggest significant acute traumatic injury to the thorax. The appearance of the chest suggests bronchitis, as above. Although some of this may be chronic, particularly based on comparison with prior chest x-rays, the appearance on today's examination suggests an element of acute bronchitis as well. 2.  Atherosclerosis, including left main and 3 vessel coronary artery disease. Please note that although the presence of coronary artery calcium documents the presence of coronary artery disease, the severity of this disease and any potential stenosis cannot be assessed on this non-gated CT examination. Assessment for potential risk factor modification, dietary therapy or pharmacologic therapy may be warranted, if clinically indicated. 3. Borderline enlarged mediastinal lymph nodes are presumably reactive in the setting of underlying infection.   Electronically Signed   By: Trudie Reed M.D.   On: 03/11/2013 20:02    Scheduled Meds: . ipratropium  0.5 mg Nebulization Q4H   And  . albuterol  2.5 mg Nebulization Q4H  . enoxaparin (LOVENOX) injection  50 mg Subcutaneous Q24H  . guaiFENesin  600 mg Oral BID  . levofloxacin (LEVAQUIN) IV  500 mg Intravenous Q24H  . methylPREDNISolone (SOLU-MEDROL) injection  80 mg Intravenous Q6H   Continuous Infusions: . sodium chloride 75 mL/hr at 03/11/13 2309    Active Problems:   COPD with exacerbation   Acute respiratory failure   Leukocytosis    Time spent: 30  min    Idelia Caudell, [facility]  [facility] Pager [phone]. If 7PM-7AM, please contact night-coverage at [email or web address], password [facility] 03/12/2013, 7:33 AM  LOS: 1 day

## 2013-03-13 DIAGNOSIS — I10 Essential (primary) hypertension: Secondary | ICD-10-CM

## 2013-03-13 DIAGNOSIS — F17200 Nicotine dependence, unspecified, uncomplicated: Secondary | ICD-10-CM

## 2013-03-13 DIAGNOSIS — E871 Hypo-osmolality and hyponatremia: Secondary | ICD-10-CM

## 2013-03-13 LAB — HEMOGLOBIN A1C: Mean Plasma Glucose: 128 mg/dL — ABNORMAL HIGH (ref <117)

## 2013-03-13 MED ORDER — NICOTINE 21 MG/24HR TD PT24: 1.0000 | MEDICATED_PATCH | Freq: Every day | TRANSDERMAL | Status: DC

## 2013-03-13 MED ORDER — BECLOMETHASONE DIPROPIONATE 40 MCG/ACT IN AERS: 2.0000 | INHALATION_SPRAY | Freq: Two times a day (BID) | RESPIRATORY_TRACT | Status: DC

## 2013-03-13 MED ORDER — PREDNISONE 10 MG PO TABS: ORAL_TABLET | ORAL | Status: DC

## 2013-03-13 MED ORDER — IPRATROPIUM-ALBUTEROL 20-100 MCG/ACT IN AERS: 1.0000 | INHALATION_SPRAY | Freq: Four times a day (QID) | RESPIRATORY_TRACT | Status: DC

## 2013-03-13 MED ORDER — PREDNISONE 50 MG PO TABS
60.0000 mg | ORAL_TABLET | Freq: Every day | ORAL | Status: DC
  Filled 2013-03-13: qty 1

## 2013-03-13 NOTE — Progress Notes (Signed)
[facility] PROGRESS NOTE  Roger Fields ZOX:[id number] DOB: 08/02/1955 DOA: 03/11/2013 PCP: No primary provider on file.  Assessment/Plan  Acute hypoxic respiratory failure likely secondary to Acute COPD exacerbation in a current smoker patient. -  Wean oxygen as tolerated -  Solumedrol x 1 today and transition to prednisone tomorrow -  Continue q4h duonebs -  Continue levofloxacin day 3 -  Okay for patient to receive his vaccinations  Leukocytosis with history of fever.  May have bronchitis, but no PNA on CT.   -  UA neg -  Will likely rise in the setting of steroids  Hypertension with normal BP on no medications -  Continue to hold lisinopril -  Encouraged eating and drinking  Borderline enlarged mediastinal lymph nodes are presumably reactive in the setting of underlying infection. -  May need follow up CT to evaluate lymphadenopathy in a few weeks  Cigarette nicotine dependence with withdrawal -  Counseled cessation -  Nursing staff to provide more education -  Nicotine patch  Telemetry:  NSR.  Okay to d/c.    Diet:  Healthy heart Access:  PIV IVF:  off Proph:  lovenox  Code Status: full Family Communication: patient and wife Disposition Plan:  Home tomorrow if oxygen saturations maintained > 88% on room air while exerting himself   Consultants:  None  Procedures:  CT angio chest 10/10  Antibiotics:  Ceftriaxone and azithromycin x 1 on 10/10  Levofloxacin 10/10 >>   HPI/Subjective:  Patient states that he is breathing much easier today.  Mild cough.  States he is always wheezy at baseline.  Asking when he can go home.    Objective: Filed Vitals:   03/12/13 2116 03/12/13 2350 03/13/13 0503 03/13/13 0807  BP: 123/73  123/68   Pulse: 99  84   Temp: 97.4 F (36.3 C)  97.8 F (36.6 C)   TempSrc: Oral  Oral   Resp: 20 18 18    Height:      Weight:      SpO2: 90%  91% 90%    Intake/Output Summary (Last 24 hours) at 03/13/13  1025 Last data filed at 03/13/13 0900  Gross per 24 hour  Intake    840 ml  Output   2125 ml  Net  -1285 ml   Filed Weights   03/11/13 2235  Weight: 102.2 kg (225 lb 5 oz)    Exam:   General:  CM, No acute distress, nasal canula in place  HEENT:  NCAT, MMM  Cardiovascular:  RRR, nl S1, S2 no mrg, 2+ pulses, warm extremities  Respiratory:  Diminished bilateral breath sounds with full expiratory wheeze and prolonged expiratory phase, no rhonchi or focal rales, no increased WOB at rest.  Stable from yesterday.    Abdomen:   NABS, soft, NT/ND  MSK:   Normal tone and bulk, no LEE  Neuro:  Grossly intact  Data Reviewed: Basic Metabolic Panel:  Recent Labs Lab 03/11/13 1630 03/12/13 0345  NA 134* 133*  K 3.8 4.1  CL 100 100  CO2 24 25  GLUCOSE 118* 164*  BUN 13 13  CREATININE 0.84 0.89  CALCIUM 9.2 9.0   Liver Function Tests:  Recent Labs Lab 03/11/13 1630  AST 15  ALT 12  ALKPHOS 92  BILITOT 0.7  PROT 7.0  ALBUMIN 3.8   No results found for this basename: LIPASE, AMYLASE,  in the last 168 hours No results found for this basename: AMMONIA,  in the last 168  hours CBC:  Recent Labs Lab 03/11/13 1630 03/12/13 0345  WBC 15.5* 18.5*  NEUTROABS 12.2*  --   HGB 14.3 13.6  HCT 40.5 39.8  MCV 90.4 91.1  PLT 275 250   Cardiac Enzymes: No results found for this basename: CKTOTAL, CKMB, CKMBINDEX, TROPONINI,  in the last 168 hours BNP (last 3 results)  Recent Labs  12/25/12 0715  PROBNP 151.2*   CBG: No results found for this basename: GLUCAP,  in the last 168 hours  Recent Results (from the past 240 hour(s))  MRSA PCR SCREENING     Status: None   Collection Time    03/11/13 10:55 PM      Result Value Range Status   MRSA by PCR NEGATIVE  NEGATIVE Final   Comment:            The GeneXpert MRSA Assay (FDA     approved for NASAL specimens     only), is one component of a     comprehensive MRSA colonization     surveillance program. It is not      intended to diagnose MRSA     infection nor to guide or     monitor treatment for     MRSA infections.     Studies: Dg Chest 2 View  03/11/2013   CLINICAL DATA:  Chest pain. COPD.  EXAM: CHEST  2 VIEW  COMPARISON:  CHEST x-ray 12/25/2012.  FINDINGS: Mild diffuse peribronchial cuffing, similar to the prior study. Lung volumes are normal. No consolidative airspace disease. No pleural effusions. No pneumothorax. No pulmonary nodule or mass noted. Pulmonary vasculature and the cardiomediastinal silhouette are within normal limits.  IMPRESSION: * No radiographic evidence of acute cardiopulmonary disease. *Mild diffuse peribronchial cuffing may indicate mild chronic bronchitis, and is similar to prior to prior study.   Electronically Signed   By: Trudie Reed M.D.   On: 03/11/2013 18:05   Ct Chest W Contrast  03/11/2013   CLINICAL DATA:  History of trauma after falling off a truck. Shortness of breath and intermittent chest pain.  EXAM: CT CHEST WITH CONTRAST  TECHNIQUE: Multidetector CT imaging of the chest was performed during intravenous contrast administration.  CONTRAST:  80mL OMNIPAQUE IOHEXOL 300 MG/ML  SOLN  COMPARISON:  No priors.  FINDINGS: Mediastinum: Heart size is normal. There is no significant pericardial fluid, thickening or pericardial calcification. There is atherosclerosis of the thoracic aorta, the great vessels of the mediastinum and the coronary arteries, including calcified atherosclerotic plaque in the left main, left anterior descending, left circumflex and right coronary arteries. Numerous borderline enlarged mediastinal and bilateral hilar lymph nodes are nonspecific and may be reactive. No definite pathologically enlarged lymph nodes are identified. Esophagus is unremarkable in appearance.  Lungs/Pleura: Widespread bronchial wall thickening is noted. There is some associated thickening of the peribronchovascular interstitium throughout the lung bases bilaterally with some  very tiny peribronchovascular micronodularity in the lower lobes. No confluent consolidative airspace disease. No pleural effusions. Mild centrilobular emphysema. No definite large suspicious appearing pulmonary nodules or masses are identified. Evaluation of the lung bases is significantly limited by a large amount of respiratory motion.  Upper Abdomen: Unremarkable.  Musculoskeletal: No acute displaced fractures in the visualized portions of the skeleton. There are no aggressive appearing lytic or blastic lesions noted in the visualized portions of the skeleton.  IMPRESSION: 1. No evidence to suggest significant acute traumatic injury to the thorax. The appearance of the chest suggests bronchitis, as above. Although some  of this may be chronic, particularly based on comparison with prior chest x-rays, the appearance on today's examination suggests an element of acute bronchitis as well. 2. Atherosclerosis, including left main and 3 vessel coronary artery disease. Please note that although the presence of coronary artery calcium documents the presence of coronary artery disease, the severity of this disease and any potential stenosis cannot be assessed on this non-gated CT examination. Assessment for potential risk factor modification, dietary therapy or pharmacologic therapy may be warranted, if clinically indicated. 3. Borderline enlarged mediastinal lymph nodes are presumably reactive in the setting of underlying infection.   Electronically Signed   By: Trudie Reed M.D.   On: 03/11/2013 20:02    Scheduled Meds: . ipratropium  0.5 mg Nebulization Q4H   And  . albuterol  2.5 mg Nebulization Q4H  . enoxaparin (LOVENOX) injection  50 mg Subcutaneous Q24H  . guaiFENesin  600 mg Oral BID  . levofloxacin (LEVAQUIN) IV  500 mg Intravenous Q24H  . nicotine  21 mg Transdermal Daily  . [START ON 03/14/2013] predniSONE  60 mg Oral Q breakfast   Continuous Infusions:    Active Problems:   COPD with  exacerbation   Acute respiratory failure   Leukocytosis   Cigarette nicotine dependence with withdrawal   Essential hypertension, benign   Hypotension, unspecified   Hyponatremia    Time spent: 30 min    Aveya Beal, [facility]  [facility] Pager [phone]. If 7PM-7AM, please contact night-coverage at [email or web address], password [facility] 03/13/2013, 10:25 AM  LOS: 2 days

## 2013-03-13 NOTE — Progress Notes (Signed)
Spoke to CM about finding a provider. Spoke with Helmut Muster. She stated that patient was educated about how to find a provider and patient understands at this time. Will discharge patient. Setzer, Don Broach

## 2013-03-13 NOTE — Progress Notes (Signed)
Patient ambulated around unit  >200 feet. Patient tolerated well and oxygen remained at 95% on room air. Will continue to monitor patient. Setzer, Don Broach

## 2013-03-13 NOTE — Discharge Summary (Signed)
Physician Discharge Summary  Roger Fields ZOX:[id number] DOB: 1956-05-23 DOA: 03/11/2013  PCP: No primary provider on file.  Admit date: 03/11/2013 Discharge date: 03/13/2013  Recommendations for Outpatient Follow-up:  1. Primary care doctor in 1 week for repeat evaluation of breathing.  Referral for PFTs.  Repeat CBC for leukocytosis and BMP for hyponatremia.  Check A1c in 3 months because borderline elevated.  Smoking cessation counseling. Consider repeat CT scan chest in 4 weeks to see if lymph nodes are enlarging.     Discharge Diagnoses:  Active Problems:   COPD with exacerbation   Acute respiratory failure   Leukocytosis   Cigarette nicotine dependence with withdrawal   Essential hypertension, benign   Hypotension, unspecified   Hyponatremia   Discharge Condition: stable, improved  Diet recommendation: healthy heart  Wt Readings from Last 3 Encounters:  03/11/13 102.2 kg (225 lb 5 oz)    History of present illness:  Roger Fields is a 57 y.o. male with PMH significant for COPD, current smoker 1 PPD, Hypertension, he moved from [place] this year. He doesn't have a PCP. He presents to ED complaining of worsening SOB for last 3 to 4 days. Worsening cough. Reports fever at 104 at home. He has not notice improvement of SOB with nebulizer treatments. He fell off his truck and landed on his left lateral back days ago.  He denies chest pain at this time. He is breathing better.  He is sleepy but answer questions appropriately. He received tramadol in the ED. He relates he has not been able to sleep for last 3 days. ABG is pending.   Hospital Course:  Acute hypoxic respiratory failure likely secondary to Acute COPD exacerbation in a current smoker.  He was started on IV solumedrol, duonebs and antibiotics.  His respiratory distress improved and he was weaned to room air.  He ambulated on room air maintaining oxygen saturations > 95% on the day of discharge.  He completed a  3-day course of antibiotics in the hospital and does not need to continue them as an outpatient as he did not have a pneumonia.  He should complete a steroid taper and he was given a prescription for beclemethasone controller inhaler and combivent.  He should have outpatient PFTs done when he has recovered from this episode.  He was encouraged to stop smoking.    Leukocytosis with history of fever. May have bronchitis, but no PNA on CT.  UA was negative.  His leukocytosis rose most likely because of steroids.  PCP to repeat CBC in 1-2 weeks.    Hypertension with normal BP on no medications.  He should stop his lisinopril for now and talk to his primary care doctor about when to restart this medication.  He should eat a low sodium diet.   Borderline enlarged mediastinal lymph nodes are presumably reactive in the setting of underlying infection.  Repeat CT chest to evaluate lymphadenopathy in a few weeks   Cigarette nicotine dependence with withdrawal.  Counseled to stop smoking and provided information.  Given nicotine patch.    Consultants:  None Procedures:  CT angio chest 10/10 Antibiotics:  Ceftriaxone and azithromycin x 1 on 10/10  Levofloxacin 10/10 >> 10/12   Discharge Exam: Filed Vitals:   03/13/13 0503  BP: 123/68  Pulse: 84  Temp: 97.8 F (36.6 C)  Resp: 18   Filed Vitals:   03/13/13 0503 03/13/13 0807 03/13/13 1232 03/13/13 1309  BP: 123/68     Pulse: 84  Temp: 97.8 F (36.6 C)     TempSrc: Oral     Resp: 18     Height:      Weight:      SpO2: 91% 90% 94% 95%    General: CM, No acute distress HEENT: NCAT, MMM  Cardiovascular: RRR, nl S1, S2 no mrg, 2+ pulses, warm extremities  Respiratory: Diminished bilateral breath sounds with full expiratory wheeze and prolonged expiratory phase, no rhonchi or focal rales, no increased WOB at rest. Stable from yesterday.  Abdomen: NABS, soft, NT/ND  MSK: Normal tone and bulk, no LEE  Neuro: Grossly intact   Discharge  Instructions      Discharge Orders   Future Orders Complete By Expires   Call MD for:  difficulty breathing, headache or visual disturbances  As directed    Call MD for:  extreme fatigue  As directed    Call MD for:  hives  As directed    Call MD for:  persistant dizziness or light-headedness  As directed    Call MD for:  persistant nausea and vomiting  As directed    Call MD for:  severe uncontrolled pain  As directed    Call MD for:  temperature >100.4  As directed    Diet - low sodium heart healthy  As directed    Discharge instructions  As directed    Comments:     You were hospitalized with shortness of breath due to COPD which is caused by smoking.  Please stop smoking.  You will need to take prednisone in a tapering dose over the next 1-2 weeks to decrease inflammation of the lungs.  Start using inhaled beclemethasone which helps prevent COPD exacerbations.  Use your combivent inhaler every 4-6 hours until you follow up with your doctor.  I have included information about local primary care doctors and the number for the pulmonology clinic to schedule an appointment.  You had some mildly enlarged lymph nodes on your CAT scan which is probable the result of bronchitis.  Please talk to your doctor about possibly repeating your CAT scan later this month to make sure they are getting smaller.  Finally, your blood pressure has been low normal since you were hospitalized.  Please stop taking your lisinopril and have your blood pressure rechecked at your next office visit.  Your doctor can decide to restart this medication if your blood pressure has risen.  If you start feeling more Roger Fields of breath, please return to the hospital.   Increase activity slowly  As directed        Medication List    STOP taking these medications       lisinopril 10 MG tablet  Commonly known as:  PRINIVIL,ZESTRIL      TAKE these medications       albuterol 108 (90 BASE) MCG/ACT inhaler  Commonly known as:   PROVENTIL HFA;VENTOLIN HFA  Inhale 2 puffs into the lungs every 4 (four) hours as needed for wheezing.     BC HEADACHE POWDER PO  Take 1 each by mouth daily as needed (pain).     beclomethasone 40 MCG/ACT inhaler  Commonly known as:  QVAR  Inhale 2 puffs into the lungs 2 (two) times daily.     ibuprofen 200 MG tablet  Commonly known as:  ADVIL,MOTRIN  Take 400 mg by mouth every 6 (six) hours as needed for pain.     Ipratropium-Albuterol 20-100 MCG/ACT Aers respimat  Commonly known as:  COMBIVENT  Inhale 1 puff into the lungs every 6 (six) hours.     nicotine 21 mg/24hr patch  Commonly known as:  NICODERM CQ - dosed in mg/24 hours  Place 1 patch onto the skin daily.     predniSONE 10 MG tablet  Commonly known as:  DELTASONE  Take 6 tabs daily x 2 days, 5 tabs daily x 2 days, 4 tabs daily x 2 days, 3 tabs daily x 2 days, 2 tabs daily x 2 days, 1 tab daily x 2 days, then stop       Follow-up Information   Follow up with Primary care. Schedule an appointment as soon as possible for a visit in 1 week.   Contact information:   May call [phone] for assistance finding a primary care doctor.        Follow up with Jovita Kussmaul.   Contact information:   [address] Ph:  [phone] Open Mon-Fri and limited Sat hours       The results of significant diagnostics from this hospitalization (including imaging, microbiology, ancillary and laboratory) are listed below for reference.    Significant Diagnostic Studies: Dg Chest 2 View  03/11/2013   CLINICAL DATA:  Chest pain. COPD.  EXAM: CHEST  2 VIEW  COMPARISON:  CHEST x-ray 12/25/2012.  FINDINGS: Mild diffuse peribronchial cuffing, similar to the prior study. Lung volumes are normal. No consolidative airspace disease. No pleural effusions. No pneumothorax. No pulmonary nodule or mass noted. Pulmonary vasculature and the cardiomediastinal silhouette are within normal limits.  IMPRESSION: * No  radiographic evidence of acute cardiopulmonary disease. *Mild diffuse peribronchial cuffing may indicate mild chronic bronchitis, and is similar to prior to prior study.   Electronically Signed   By: Trudie Reed M.D.   On: 03/11/2013 18:05   Ct Chest W Contrast  03/11/2013   CLINICAL DATA:  History of trauma after falling off a truck. Shortness of breath and intermittent chest pain.  EXAM: CT CHEST WITH CONTRAST  TECHNIQUE: Multidetector CT imaging of the chest was performed during intravenous contrast administration.  CONTRAST:  80mL OMNIPAQUE IOHEXOL 300 MG/ML  SOLN  COMPARISON:  No priors.  FINDINGS: Mediastinum: Heart size is normal. There is no significant pericardial fluid, thickening or pericardial calcification. There is atherosclerosis of the thoracic aorta, the great vessels of the mediastinum and the coronary arteries, including calcified atherosclerotic plaque in the left main, left anterior descending, left circumflex and right coronary arteries. Numerous borderline enlarged mediastinal and bilateral hilar lymph nodes are nonspecific and may be reactive. No definite pathologically enlarged lymph nodes are identified. Esophagus is unremarkable in appearance.  Lungs/Pleura: Widespread bronchial wall thickening is noted. There is some associated thickening of the peribronchovascular interstitium throughout the lung bases bilaterally with some very tiny peribronchovascular micronodularity in the lower lobes. No confluent consolidative airspace disease. No pleural effusions. Mild centrilobular emphysema. No definite large suspicious appearing pulmonary nodules or masses are identified. Evaluation of the lung bases is significantly limited by a large amount of respiratory motion.  Upper Abdomen: Unremarkable.  Musculoskeletal: No acute displaced fractures in the visualized portions of the skeleton. There are no aggressive appearing lytic or blastic lesions noted in the visualized portions of the  skeleton.  IMPRESSION: 1. No evidence to suggest significant acute traumatic injury to the thorax. The appearance of the chest suggests bronchitis, as above. Although some of this may be chronic, particularly based on comparison with prior chest x-rays, the appearance on today's  examination suggests an element of acute bronchitis as well. 2. Atherosclerosis, including left main and 3 vessel coronary artery disease. Please note that although the presence of coronary artery calcium documents the presence of coronary artery disease, the severity of this disease and any potential stenosis cannot be assessed on this non-gated CT examination. Assessment for potential risk factor modification, dietary therapy or pharmacologic therapy may be warranted, if clinically indicated. 3. Borderline enlarged mediastinal lymph nodes are presumably reactive in the setting of underlying infection.   Electronically Signed   By: Trudie Reed M.D.   On: 03/11/2013 20:02    Microbiology: Recent Results (from the past 240 hour(s))  MRSA PCR SCREENING     Status: None   Collection Time    03/11/13 10:55 PM      Result Value Range Status   MRSA by PCR NEGATIVE  NEGATIVE Final   Comment:            The GeneXpert MRSA Assay (FDA     approved for NASAL specimens     only), is one component of a     comprehensive MRSA colonization     surveillance program. It is not     intended to diagnose MRSA     infection nor to guide or     monitor treatment for     MRSA infections.     Labs: Basic Metabolic Panel:  Recent Labs Lab 03/11/13 1630 03/12/13 0345  NA 134* 133*  K 3.8 4.1  CL 100 100  CO2 24 25  GLUCOSE 118* 164*  BUN 13 13  CREATININE 0.84 0.89  CALCIUM 9.2 9.0   Liver Function Tests:  Recent Labs Lab 03/11/13 1630  AST 15  ALT 12  ALKPHOS 92  BILITOT 0.7  PROT 7.0  ALBUMIN 3.8   No results found for this basename: LIPASE, AMYLASE,  in the last 168 hours No results found for this basename:  AMMONIA,  in the last 168 hours CBC:  Recent Labs Lab 03/11/13 1630 03/12/13 0345  WBC 15.5* 18.5*  NEUTROABS 12.2*  --   HGB 14.3 13.6  HCT 40.5 39.8  MCV 90.4 91.1  PLT 275 250   Cardiac Enzymes: No results found for this basename: CKTOTAL, CKMB, CKMBINDEX, TROPONINI,  in the last 168 hours BNP: BNP (last 3 results)  Recent Labs  12/25/12 0715  PROBNP 151.2*   CBG: No results found for this basename: GLUCAP,  in the last 168 hours  Time coordinating discharge: 45 minutes  Signed:  Halim Surrette  [facility] 03/13/2013, 1:26 PM

## 2013-03-13 NOTE — Progress Notes (Signed)
   CARE MANAGEMENT NOTE 03/13/2013  Patient:  Roger Fields, Roger Fields   Account Number:  [id number]  Date Initiated:  03/12/2013  Documentation initiated by:  [facility]  Subjective/Objective Assessment:   57 year old male admitted with COPD exacerbation.     Action/Plan:   From home.   Anticipated DC Date:  03/15/2013   Anticipated DC Plan:  HOME/SELF CARE      DC Planning Services  CM consult  PCP issues      Choice offered to / List presented to:             Status of service:  Completed, signed off Medicare Important Message given?  NA - LOS <3 / Initial given by admissions (If response is "NO", the following Medicare IM given date fields will be blank) Date Medicare IM given:   Date Additional Medicare IM given:    Discharge Disposition:  HOME/SELF CARE  Per UR Regulation:  Reviewed for med. necessity/level of care/duration of stay  If discussed at Long Length of Stay Meetings, dates discussed:    Comments:  03/13/2013 1400 NCM spoke to pt and states he has PCP in [place] but does not have PCP in [place]. He is working here temp. He has insurance. NCM explained to pt to contact the toll free number on his insurance card and request a list of providers. Provided pt with contact info [facility] on his dc instruction. Explained he can call and get a info on PCP in [place] that accept his insurance. Pt agreed to follow up to locate a local PCP.  Isidoro Donning RN CCM Case Mgmt phone [phone]

## 2013-03-14 NOTE — Progress Notes (Signed)
Discharge summary sent to payer through [facility]  

## 2013-05-11 ENCOUNTER — Encounter: Payer: Self-pay | Admitting: *Deleted

## 2013-05-12 ENCOUNTER — Institutional Professional Consult (permissible substitution): Payer: PRIVATE HEALTH INSURANCE | Admitting: Internal Medicine

## 2013-05-13 ENCOUNTER — Encounter: Payer: Self-pay | Admitting: Internal Medicine

## 2013-06-15 ENCOUNTER — Encounter: Payer: Self-pay | Admitting: Internal Medicine

## 2013-06-15 ENCOUNTER — Ambulatory Visit (INDEPENDENT_AMBULATORY_CARE_PROVIDER_SITE_OTHER): Payer: PRIVATE HEALTH INSURANCE | Admitting: Internal Medicine

## 2013-06-15 VITALS — BP 142/90 | HR 70 | Temp 97.8°F | Ht 72.0 in | Wt 235.0 lb

## 2013-06-15 DIAGNOSIS — G8929 Other chronic pain: Secondary | ICD-10-CM | POA: Insufficient documentation

## 2013-06-15 DIAGNOSIS — J441 Chronic obstructive pulmonary disease with (acute) exacerbation: Secondary | ICD-10-CM

## 2013-06-15 DIAGNOSIS — F19939 Other psychoactive substance use, unspecified with withdrawal, unspecified: Secondary | ICD-10-CM

## 2013-06-15 DIAGNOSIS — R079 Chest pain, unspecified: Secondary | ICD-10-CM

## 2013-06-15 DIAGNOSIS — M549 Dorsalgia, unspecified: Secondary | ICD-10-CM

## 2013-06-15 DIAGNOSIS — F17200 Nicotine dependence, unspecified, uncomplicated: Secondary | ICD-10-CM

## 2013-06-15 DIAGNOSIS — F17213 Nicotine dependence, cigarettes, with withdrawal: Secondary | ICD-10-CM

## 2013-06-15 MED ORDER — PANTOPRAZOLE SODIUM 40 MG PO TBEC: 40.0000 mg | DELAYED_RELEASE_TABLET | Freq: Every day | ORAL | Status: DC

## 2013-06-15 MED ORDER — PREDNISONE 10 MG PO TABS: ORAL_TABLET | ORAL | Status: DC

## 2013-06-15 MED ORDER — FAMOTIDINE 20 MG PO TABS: ORAL_TABLET | ORAL | Status: DC

## 2013-06-15 MED ORDER — HYDROCODONE-ACETAMINOPHEN 5-300 MG PO TABS: ORAL_TABLET | ORAL | Status: DC

## 2013-06-15 MED ORDER — ALBUTEROL SULFATE HFA 108 (90 BASE) MCG/ACT IN AERS: 2.0000 | INHALATION_SPRAY | RESPIRATORY_TRACT | Status: DC | PRN

## 2013-06-15 NOTE — Assessment & Plan Note (Signed)
He as having sweats from ? narc w/d in office so I gave him limited supply of vicodin and referrd back to primary care for referral if approp to ortho or pain clinic but no further refill through this office

## 2013-06-15 NOTE — Patient Instructions (Addendum)
symbicort 160 Take 2 puffs first thing in am and then another 2 puffs about 12 hours later.   Prednisone 10 mg take  4 each am x 2 days,   2 each am x 2 days,  1 each am x 2 days and stop   Work on inhaler technique:  relax and gently blow all the way out then take a nice smooth deep breath back in, triggering the inhaler at same time you start breathing in.  Hold for up to 5 seconds if you can.  Rinse and gargle with water when done  Only use your albuterol(proventil) as a rescue medication to be used if you can't catch your breath by resting or doing a relaxed purse lip breathing pattern.  - The less you use it, the better it will work when you need it. - Ok to use up to 2 puffs  every 4 hours if you must but call for immediate appointment if use goes up over your usual need - Don't leave home without it !!  (think of it like the spare tire for your car) - ok to use neb up to every 4 hours if the proventil does not work  Pantoprazole (protonix) 40 mg   Take 30-60 min before first meal of the day and Pepcid 20 mg one bedtime until return to office - this is the best way to tell whether stomach acid is contributing to your problem.    GERD (REFLUX)  is an extremely common cause of respiratory symptoms, many times with no significant heartburn at all.    It can be treated with medication, but also with lifestyle changes including avoidance of late meals, excessive alcohol, smoking cessation, and avoid fatty foods, chocolate, peppermint, colas, red wine, and acidic juices such as orange juice.  NO MINT OR MENTHOL PRODUCTS SO NO COUGH DROPS  USE SUGARLESS CANDY INSTEAD (jolley ranchers or Stover's)  NO OIL BASED VITAMINS - use powdered substitutes.  Please schedule a follow up office visit in 2 weeks, sooner if needed - no work in meantime

## 2013-06-15 NOTE — Assessment & Plan Note (Signed)

## 2013-06-15 NOTE — Progress Notes (Signed)
   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 Ok sitting but has to sleep at 45% feels like smothering and coughing dry mostly. Some better p saba despite very poor technique and no better on symbicort 160  Worse snce  stopped prednisone x 7 days prior to OV   Has generalized back pain since traum 03/2013 narc dep/ neg ct chest at that point, not pleuritic  No obvious day to day or daytime variabilty or assoc pleuritic or ex cp or chest tightness, subjective wheeze overt sinus or hb symptoms. No unusual exp hx or h/o childhood pna/ asthma or knowledge of premature birth.  Sleeping ok without nocturnal  or early am exacerbation  of respiratory  c/o's or need for noct saba. Also denies any obvious fluctuation of symptoms with weather or environmental changes or other aggravating or alleviating factors except as outlined above   Current Medications, Allergies, Complete Past Medical History, Past Surgical History, Family History, and Social History were reviewed in [facility] electronic medical record.          Review of Systems  Constitutional: Positive for appetite change. Negative for fever, chills, activity change and unexpected weight change.  HENT: Negative for congestion, dental problem, postnasal drip, rhinorrhea, sneezing, sore throat, trouble swallowing and voice change.   Eyes: Negative for visual disturbance.  Respiratory: Positive for cough and shortness of breath. Negative for choking.   Cardiovascular: Positive for chest  pain. Negative for leg swelling.  Gastrointestinal: Negative for nausea, vomiting and abdominal pain.  Genitourinary: Negative for difficulty urinating.  Musculoskeletal: Negative for arthralgias.  Skin: Negative for rash.  Psychiatric/Behavioral: Negative for behavioral problems and confusion.       Objective:   Physical Exam  Wt Readings from Last 3 Encounters:  06/15/13 235 lb (106.595 kg)  03/11/13 225 lb 5 oz (102.2 kg)       HEENT mild turbinate edema.  Oropharynx no thrush or excess pnd or cobblestoning.  No JVD or cervical adenopathy. Mild accessory muscle hypertrophy. Trachea midline, nl thryroid. Chest was hyperinflated by percussion with diminished breath sounds and moderate increased exp time without wheeze. Hoover sign positive at mid inspiration. Regular rate and rhythm without murmur gallop or rub or increase P2 or edema.  Abd: no hsm, nl excursion. Ext warm without cyanosis or clubbing.     cxr "last week"  = 04/17/13  Acute versus chronic perihilar and bronchitic change. If acute, this  could represent a viral syndrome or reactive airway disease.    EKG 06/15/2013 > no ischemic changes     Assessment & Plan:

## 2013-06-15 NOTE — Assessment & Plan Note (Signed)
DDX of  difficult airways managment all start with A and  include Adherence, Ace Inhibitors, Acid Reflux, Active Sinus Disease, Alpha 1 Antitripsin deficiency, Anxiety masquerading as Airways dz,  ABPA,  allergy(esp in young), Aspiration (esp in elderly), Adverse effects of DPI,  Active smokers, plus two Bs  = Bronchiectasis and Beta blocker use..and one C= CHF  Adherence is always the initial "prime suspect" and is a multilayered concern that requires a "trust but verify" approach in every patient - starting with knowing how to use medications, especially inhalers, correctly, keeping up with refills and understanding the fundamental difference between maintenance and prns vs those medications only taken for a very short course and then stopped and not refilled.  - 06/15/2013 p extensive coaching HFA effectiveness =    75% from a very poor baseline so ok to continue symbiocrt 160 2bid  Active smoking greatest concern > see sep a/p  ? Acid (or non-acid) GERD > always difficult to exclude as up to 75% of pts in some series report no assoc GI/ Heartburn symptoms> rec max (24h)  acid suppression and diet restrictions/ reviewed and instructions given in writing.   See instructions for specific recommendations which were reviewed directly with the patient who was given a copy with highlighter outlining the key components.

## 2013-06-29 ENCOUNTER — Ambulatory Visit: Payer: PRIVATE HEALTH INSURANCE | Admitting: Internal Medicine

## 2013-07-04 ENCOUNTER — Encounter: Payer: Self-pay | Admitting: Internal Medicine

## 2013-12-15 ENCOUNTER — Emergency Department (HOSPITAL_COMMUNITY): Payer: PRIVATE HEALTH INSURANCE

## 2013-12-15 ENCOUNTER — Inpatient Hospital Stay (HOSPITAL_COMMUNITY)
Admission: EM | Admit: 2013-12-15 | Discharge: 2013-12-19 | DRG: 208 | Disposition: A | Discharge status: Home / Self Care | Payer: PRIVATE HEALTH INSURANCE | Attending: Emergency Medicine | Admitting: Emergency Medicine

## 2013-12-15 ENCOUNTER — Encounter (HOSPITAL_COMMUNITY): Payer: Self-pay | Admitting: Emergency Medicine

## 2013-12-15 DIAGNOSIS — J9600 Acute respiratory failure, unspecified whether with hypoxia or hypercapnia: Secondary | ICD-10-CM | POA: Diagnosis present

## 2013-12-15 DIAGNOSIS — Z79899 Other long term (current) drug therapy: Secondary | ICD-10-CM

## 2013-12-15 DIAGNOSIS — I509 Heart failure, unspecified: Secondary | ICD-10-CM | POA: Diagnosis present

## 2013-12-15 DIAGNOSIS — E871 Hypo-osmolality and hyponatremia: Secondary | ICD-10-CM

## 2013-12-15 DIAGNOSIS — E872 Acidosis, unspecified: Secondary | ICD-10-CM | POA: Diagnosis present

## 2013-12-15 DIAGNOSIS — J441 Chronic obstructive pulmonary disease with (acute) exacerbation: Principal | ICD-10-CM | POA: Diagnosis present

## 2013-12-15 DIAGNOSIS — G8929 Other chronic pain: Secondary | ICD-10-CM

## 2013-12-15 DIAGNOSIS — F17200 Nicotine dependence, unspecified, uncomplicated: Secondary | ICD-10-CM | POA: Diagnosis present

## 2013-12-15 DIAGNOSIS — I959 Hypotension, unspecified: Secondary | ICD-10-CM

## 2013-12-15 DIAGNOSIS — J9819 Other pulmonary collapse: Secondary | ICD-10-CM | POA: Diagnosis present

## 2013-12-15 DIAGNOSIS — Z8249 Family history of ischemic heart disease and other diseases of the circulatory system: Secondary | ICD-10-CM

## 2013-12-15 DIAGNOSIS — M549 Dorsalgia, unspecified: Secondary | ICD-10-CM

## 2013-12-15 DIAGNOSIS — I5032 Chronic diastolic (congestive) heart failure: Secondary | ICD-10-CM | POA: Diagnosis present

## 2013-12-15 DIAGNOSIS — F17213 Nicotine dependence, cigarettes, with withdrawal: Secondary | ICD-10-CM

## 2013-12-15 DIAGNOSIS — G9340 Encephalopathy, unspecified: Secondary | ICD-10-CM | POA: Diagnosis present

## 2013-12-15 DIAGNOSIS — R000 Tachycardia, unspecified: Secondary | ICD-10-CM | POA: Diagnosis present

## 2013-12-15 DIAGNOSIS — Z791 Long term (current) use of non-steroidal anti-inflammatories (NSAID): Secondary | ICD-10-CM

## 2013-12-15 DIAGNOSIS — J45901 Unspecified asthma with (acute) exacerbation: Principal | ICD-10-CM

## 2013-12-15 DIAGNOSIS — K219 Gastro-esophageal reflux disease without esophagitis: Secondary | ICD-10-CM | POA: Diagnosis present

## 2013-12-15 DIAGNOSIS — D72829 Elevated white blood cell count, unspecified: Secondary | ICD-10-CM

## 2013-12-15 DIAGNOSIS — J811 Chronic pulmonary edema: Secondary | ICD-10-CM | POA: Diagnosis present

## 2013-12-15 DIAGNOSIS — J9690 Respiratory failure, unspecified, unspecified whether with hypoxia or hypercapnia: Secondary | ICD-10-CM

## 2013-12-15 DIAGNOSIS — I10 Essential (primary) hypertension: Secondary | ICD-10-CM | POA: Diagnosis present

## 2013-12-15 LAB — HEMOGLOBIN A1C
HEMOGLOBIN A1C: 6.3 % — AB (ref <5.7)
MEAN PLASMA GLUCOSE: 134 mg/dL — AB (ref <117)

## 2013-12-15 LAB — CBC WITH DIFFERENTIAL/PLATELET
Basophils Absolute: 0 10*3/uL (ref 0.0–0.1)
Basophils Relative: 0 % (ref 0–1)
Eosinophils Absolute: 0.6 10*3/uL (ref 0.0–0.7)
Eosinophils Relative: 6 % — ABNORMAL HIGH (ref 0–5)
HCT: 42.6 % (ref 39.0–52.0)
Hemoglobin: 14.5 g/dL (ref 13.0–17.0)
Lymphocytes Relative: 17 % (ref 12–46)
Lymphs Abs: 1.8 10*3/uL (ref 0.7–4.0)
MCH: 30.2 pg (ref 26.0–34.0)
MCHC: 34 g/dL (ref 30.0–36.0)
MCV: 88.8 fL (ref 78.0–100.0)
Monocytes Absolute: 1.1 10*3/uL — ABNORMAL HIGH (ref 0.1–1.0)
Monocytes Relative: 10 % (ref 3–12)
Neutro Abs: 7.1 10*3/uL (ref 1.7–7.7)
Neutrophils Relative %: 67 % (ref 43–77)
Platelets: 324 10*3/uL (ref 150–400)
RBC: 4.8 MIL/uL (ref 4.22–5.81)
RDW: 13.2 % (ref 11.5–15.5)
WBC: 10.5 10*3/uL (ref 4.0–10.5)

## 2013-12-15 LAB — BLOOD GAS, ARTERIAL
Acid-base deficit: 1.4 mmol/L (ref 0.0–2.0)
Acid-base deficit: 5.6 mmol/L — ABNORMAL HIGH (ref 0.0–2.0)
Acid-base deficit: 1.5 mmol/L (ref 0.0–2.0)
Bicarbonate: 25.7 mEq/L — ABNORMAL HIGH (ref 20.0–24.0)
Bicarbonate: 21.9 mEq/L (ref 20.0–24.0)
Bicarbonate: 23.3 mEq/L (ref 20.0–24.0)
DRAWN BY: 313061
Drawn by: 103701
Drawn by: 313061
FIO2: 1 %
FIO2: 1 %
FIO2: 0.4 %
MECHVT: 600 mL
O2 SAT: 96.1 %
O2 Saturation: 96.6 %
O2 Saturation: 99.4 %
PATIENT TEMPERATURE: 34.6
PEEP/CPAP: 5 cmH2O
PEEP: 5 cmH2O
Patient temperature: 98.6
Patient temperature: 35.1
RATE: 12 resp/min
RATE: 16 resp/min
TCO2: 23.2 mmol/L (ref 0–100)
TCO2: 20.4 mmol/L (ref 0–100)
TCO2: 21 mmol/L (ref 0–100)
VT: 600 mL
pCO2 arterial: 55.3 mmHg — ABNORMAL HIGH (ref 35.0–45.0)
pCO2 arterial: 48.9 mmHg — ABNORMAL HIGH (ref 35.0–45.0)
pCO2 arterial: 37.5 mmHg (ref 35.0–45.0)
pH, Arterial: 7.29 — ABNORMAL LOW (ref 7.350–7.450)
pH, Arterial: 7.262 — ABNORMAL LOW (ref 7.350–7.450)
pH, Arterial: 7.396 (ref 7.350–7.450)
pO2, Arterial: 99.8 mmHg (ref 80.0–100.0)
pO2, Arterial: 393 mmHg — ABNORMAL HIGH (ref 80.0–100.0)
pO2, Arterial: 73.2 mmHg — ABNORMAL LOW (ref 80.0–100.0)

## 2013-12-15 LAB — TSH: TSH: 0.994 u[IU]/mL (ref 0.350–4.500)

## 2013-12-15 LAB — MRSA PCR SCREENING: MRSA by PCR: NEGATIVE

## 2013-12-15 LAB — RAPID URINE DRUG SCREEN, HOSP PERFORMED
AMPHETAMINES: POSITIVE — AB
BENZODIAZEPINES: NOT DETECTED
Barbiturates: NOT DETECTED
Cocaine: NOT DETECTED
OPIATES: NOT DETECTED
Tetrahydrocannabinol: NOT DETECTED

## 2013-12-15 LAB — BASIC METABOLIC PANEL
Anion gap: 14 (ref 5–15)
BUN: 9 mg/dL (ref 6–23)
CO2: 24 mEq/L (ref 19–32)
Calcium: 9.1 mg/dL (ref 8.4–10.5)
Chloride: 100 mEq/L (ref 96–112)
Creatinine, Ser: 0.71 mg/dL (ref 0.50–1.35)
GFR calc Af Amer: >90 mL/min (ref >90)
GFR calc non Af Amer: >90 mL/min (ref >90)
Glucose, Bld: 117 mg/dL — ABNORMAL HIGH (ref 70–99)
Potassium: 3.8 mEq/L (ref 3.7–5.3)
Sodium: 138 mEq/L (ref 137–147)

## 2013-12-15 LAB — TROPONIN I
Troponin I: <0.3 ng/mL (ref <0.30)
Troponin I: <0.3 ng/mL (ref <0.30)
Troponin I: <0.3 ng/mL (ref <0.30)

## 2013-12-15 LAB — URINALYSIS, ROUTINE W REFLEX MICROSCOPIC
Bilirubin Urine: NEGATIVE
Glucose, UA: 100 mg/dL — AB
Ketones, ur: NEGATIVE mg/dL
Leukocytes, UA: NEGATIVE
Nitrite: NEGATIVE
Protein, ur: 100 mg/dL — AB
Specific Gravity, Urine: 1.011 (ref 1.005–1.030)
Urobilinogen, UA: 0.2 mg/dL (ref 0.0–1.0)
pH: 7 (ref 5.0–8.0)

## 2013-12-15 LAB — URINE MICROSCOPIC-ADD ON

## 2013-12-15 LAB — MAGNESIUM: MAGNESIUM: 2 mg/dL (ref 1.5–2.5)

## 2013-12-15 LAB — PHOSPHORUS: Phosphorus: 3.4 mg/dL (ref 2.3–4.6)

## 2013-12-15 LAB — PRO B NATRIURETIC PEPTIDE: Pro B Natriuretic peptide (BNP): 192.9 pg/mL — ABNORMAL HIGH (ref 0–125)

## 2013-12-15 LAB — TRIGLYCERIDES: Triglycerides: 73 mg/dL (ref <150)

## 2013-12-15 MED ORDER — LEVOFLOXACIN IN D5W 750 MG/150ML IV SOLN
750.0000 mg | INTRAVENOUS | Status: DC
Start: 2013-12-16 — End: 2013-12-18
  Administered 2013-12-16 – 2013-12-18 (×3): 750 mg via INTRAVENOUS
  Filled 2013-12-15 (×3): qty 150

## 2013-12-15 MED ORDER — FAMOTIDINE IN NACL 20-0.9 MG/50ML-% IV SOLN
20.0000 mg | Freq: Every day | INTRAVENOUS | Status: DC
  Administered 2013-12-15: 20 mg via INTRAVENOUS
  Filled 2013-12-15: qty 50

## 2013-12-15 MED ORDER — CHLORHEXIDINE GLUCONATE 0.12 % MT SOLN
OROMUCOSAL | Status: AC
Start: 2013-12-15 — End: 2013-12-15
  Administered 2013-12-15: 15 mL
  Filled 2013-12-15: qty 15

## 2013-12-15 MED ORDER — PROPOFOL 10 MG/ML IV EMUL
5.0000 ug/kg/min | INTRAVENOUS | Status: DC
  Administered 2013-12-15: 30 ug/kg/min via INTRAVENOUS
  Administered 2013-12-15: 25 ug/kg/min via INTRAVENOUS
  Administered 2013-12-15: 35 ug/kg/min via INTRAVENOUS
  Administered 2013-12-15: 40 ug/kg/min via INTRAVENOUS
  Administered 2013-12-15: 60 ug/kg/min via INTRAVENOUS
  Administered 2013-12-15 (×2): 50 ug/kg/min via INTRAVENOUS
  Administered 2013-12-16: 40 ug/kg/min via INTRAVENOUS
  Administered 2013-12-16 (×2): 20 ug/kg/min via INTRAVENOUS
  Administered 2013-12-16: 60 ug/kg/min via INTRAVENOUS
  Filled 2013-12-15 (×2): qty 100
  Filled 2013-12-15: qty 200
  Filled 2013-12-15 (×4): qty 100
  Filled 2013-12-15: qty 200

## 2013-12-15 MED ORDER — SODIUM CHLORIDE 0.9 % IV SOLN
INTRAVENOUS | Status: DC
  Administered 2013-12-15: 13:00:00 via INTRAVENOUS

## 2013-12-15 MED ORDER — SUCCINYLCHOLINE CHLORIDE 20 MG/ML IJ SOLN
INTRAMUSCULAR | Status: AC
  Filled 2013-12-15: qty 1

## 2013-12-15 MED ORDER — LABETALOL HCL 5 MG/ML IV SOLN
10.0000 mg | INTRAVENOUS | Status: DC | PRN
  Filled 2013-12-15: qty 4

## 2013-12-15 MED ORDER — IPRATROPIUM BROMIDE 0.02 % IN SOLN
0.5000 mg | Freq: Once | RESPIRATORY_TRACT | Status: AC
  Administered 2013-12-15: 0.5 mg via RESPIRATORY_TRACT

## 2013-12-15 MED ORDER — ALBUTEROL (5 MG/ML) CONTINUOUS INHALATION SOLN
15.0000 mg/h | INHALATION_SOLUTION | RESPIRATORY_TRACT | Status: DC
  Administered 2013-12-15: 15 mg/h via RESPIRATORY_TRACT

## 2013-12-15 MED ORDER — IPRATROPIUM-ALBUTEROL 0.5-2.5 (3) MG/3ML IN SOLN: 3.0000 mL | RESPIRATORY_TRACT | Status: DC

## 2013-12-15 MED ORDER — IPRATROPIUM-ALBUTEROL 0.5-2.5 (3) MG/3ML IN SOLN
3.0000 mL | Freq: Four times a day (QID) | RESPIRATORY_TRACT | Status: DC
  Administered 2013-12-15 – 2013-12-18 (×12): 3 mL via RESPIRATORY_TRACT
  Filled 2013-12-15 (×12): qty 3

## 2013-12-15 MED ORDER — ALBUTEROL SULFATE (2.5 MG/3ML) 0.083% IN NEBU: 2.5000 mg | INHALATION_SOLUTION | RESPIRATORY_TRACT | Status: DC | PRN

## 2013-12-15 MED ORDER — LEVOFLOXACIN IN D5W 750 MG/150ML IV SOLN
750.0000 mg | Freq: Once | INTRAVENOUS | Status: AC
  Administered 2013-12-15: 750 mg via INTRAVENOUS
  Filled 2013-12-15: qty 150

## 2013-12-15 MED ORDER — LIDOCAINE HCL (CARDIAC) 20 MG/ML IV SOLN
INTRAVENOUS | Status: AC
  Filled 2013-12-15: qty 5

## 2013-12-15 MED ORDER — MAGNESIUM SULFATE 40 MG/ML IJ SOLN
2.0000 g | Freq: Once | INTRAMUSCULAR | Status: AC
  Administered 2013-12-15: 2 g via INTRAVENOUS
  Filled 2013-12-15: qty 50

## 2013-12-15 MED ORDER — PANTOPRAZOLE SODIUM 40 MG IV SOLR
40.0000 mg | Freq: Every day | INTRAVENOUS | Status: DC
  Administered 2013-12-15: 40 mg via INTRAVENOUS
  Filled 2013-12-15: qty 40

## 2013-12-15 MED ORDER — FUROSEMIDE 10 MG/ML IJ SOLN: 80.0000 mg | Freq: Once | INTRAMUSCULAR | Status: DC

## 2013-12-15 MED ORDER — ROCURONIUM BROMIDE 50 MG/5ML IV SOLN
INTRAVENOUS | Status: AC
  Administered 2013-12-15: 60 mg
  Filled 2013-12-15: qty 2

## 2013-12-15 MED ORDER — KETAMINE HCL 10 MG/ML IJ SOLN
1.0000 mg/kg | Freq: Once | INTRAMUSCULAR | Status: AC
  Administered 2013-12-15: 60 mg via INTRAVENOUS
  Filled 2013-12-15: qty 10.7

## 2013-12-15 MED ORDER — LORAZEPAM 2 MG/ML IJ SOLN
1.0000 mg | Freq: Once | INTRAMUSCULAR | Status: AC
  Administered 2013-12-15: 1 mg via INTRAVENOUS
  Filled 2013-12-15: qty 1

## 2013-12-15 MED ORDER — PROPOFOL 10 MG/ML IV EMUL
5.0000 ug/kg/min | Freq: Once | INTRAVENOUS | Status: AC
  Administered 2013-12-15: 1000 mg via INTRAVENOUS
  Administered 2013-12-15: 5 ug/kg/min via INTRAVENOUS

## 2013-12-15 MED ORDER — METHYLPREDNISOLONE SODIUM SUCC 125 MG IJ SOLR: 125.0000 mg | Freq: Two times a day (BID) | INTRAMUSCULAR | Status: DC

## 2013-12-15 MED ORDER — PROPOFOL 10 MG/ML IV EMUL
INTRAVENOUS | Status: AC
  Administered 2013-12-15: 1000 mg via INTRAVENOUS
  Filled 2013-12-15: qty 100

## 2013-12-15 MED ORDER — CHLORHEXIDINE GLUCONATE 0.12 % MT SOLN
15.0000 mL | Freq: Two times a day (BID) | OROMUCOSAL | Status: DC
  Administered 2013-12-16 – 2013-12-18 (×3): 15 mL via OROMUCOSAL
  Filled 2013-12-15 (×4): qty 15

## 2013-12-15 MED ORDER — ENOXAPARIN SODIUM 60 MG/0.6ML ~~LOC~~ SOLN
50.0000 mg | SUBCUTANEOUS | Status: DC
  Administered 2013-12-15 – 2013-12-17 (×3): 50 mg via SUBCUTANEOUS
  Filled 2013-12-15 (×5): qty 0.6

## 2013-12-15 MED ORDER — TERBUTALINE SULFATE 1 MG/ML IJ SOLN
0.2500 mg | Freq: Once | INTRAMUSCULAR | Status: AC
  Administered 2013-12-15: 0.25 mg via SUBCUTANEOUS
  Filled 2013-12-15: qty 1

## 2013-12-15 MED ORDER — FENTANYL CITRATE 0.05 MG/ML IJ SOLN
100.0000 ug | INTRAMUSCULAR | Status: DC | PRN
  Administered 2013-12-15: 100 ug via INTRAVENOUS
  Filled 2013-12-15: qty 2

## 2013-12-15 MED ORDER — METHYLPREDNISOLONE SODIUM SUCC 125 MG IJ SOLR
80.0000 mg | Freq: Three times a day (TID) | INTRAMUSCULAR | Status: DC
  Administered 2013-12-15 – 2013-12-16 (×3): 80 mg via INTRAVENOUS
  Filled 2013-12-15 (×3): qty 2

## 2013-12-15 MED ORDER — SODIUM CHLORIDE 0.9 % IV SOLN
25.0000 ug/h | INTRAVENOUS | Status: DC
  Administered 2013-12-15: 50 ug/h via INTRAVENOUS
  Administered 2013-12-17: 100 ug/h via INTRAVENOUS
  Filled 2013-12-15: qty 50

## 2013-12-15 MED ORDER — BIOTENE DRY MOUTH MT LIQD
15.0000 mL | Freq: Four times a day (QID) | OROMUCOSAL | Status: DC
  Administered 2013-12-16 – 2013-12-17 (×6): 15 mL via OROMUCOSAL

## 2013-12-15 MED ORDER — ETOMIDATE 2 MG/ML IV SOLN
INTRAVENOUS | Status: AC
  Filled 2013-12-15: qty 20

## 2013-12-15 NOTE — ED Notes (Signed)
Per EMS, [place] c/o respiratory distress starting around 0300.  Hx of asthma.  EMS reports that Pt would not answer questions or allow vital signs to be completed.  125mg  Solu-Medrol, 0.5mg  Atrovent, and 5mg  Albuterol given en route.

## 2013-12-15 NOTE — ED Provider Notes (Signed)
CSN: [id number]     Arrival date & time 12/15/13  0800 History   First MD Initiated Contact with Patient 12/15/13 [phone]     Chief Complaint  Patient presents with  . Respiratory Distress     (Consider location/radiation/quality/duration/timing/severity/associated sxs/prior Treatment) HPI  57yM brought in by EMS in respiratory distress. Hx of COPD. Difficult to get thorough HPI as pt having difficulty answering questions. Worsening SOB since yesterday. Significantly worse since early this morning. Denies any pain. No fever. Hx of COPD. Not on home o2. Smoker. Reports no PCP. Note from pulmonology from January. Pt reports has no medications currently though.   Past Medical History  Diagnosis Date  . Asthma   . COPD (chronic obstructive pulmonary disease)   . Hypertension    No past surgical history on file. Family History  Problem Relation Age of Onset  . Heart disease Mother    History  Substance Use Topics  . Smoking status: Current Every Day Smoker -- 1.00 packs/day for 40 years    Types: Cigarettes  . Smokeless tobacco: Never Used  . Alcohol Use: 0.6 oz/week    1 Cans of beer per week     Comment: occassional    Review of Systems  Level 5 caveat because of severe respiratory distress.   Allergies  Review of patient's allergies indicates no known allergies.  Home Medications   Prior to Admission medications   Medication Sig Start Date End Date Taking? Authorizing Provider  albuterol (PROVENTIL HFA;VENTOLIN HFA) 108 (90 BASE) MCG/ACT inhaler Inhale 2 puffs into the lungs every 4 (four) hours as needed for wheezing. 06/15/13   Nyoka Cowden, MD  albuterol (PROVENTIL) (2.5 MG/3ML) 0.083% nebulizer solution Take 2.5 mg by nebulization every 6 (six) hours as needed for wheezing or shortness of breath.    Historical Provider, MD  budesonide-formoterol (SYMBICORT) 160-4.5 MCG/ACT inhaler Inhale 2 puffs into the lungs 2 (two) times daily.    Historical Provider, MD   famotidine (PEPCID) 20 MG tablet One at bedtime 06/15/13   Nyoka Cowden, MD  Hydrocodone-Acetaminophen (VICODIN) 5-300 MG TABS One every 4 hours as needed for back pain 06/15/13   Nyoka Cowden, MD  ibuprofen (ADVIL,MOTRIN) 200 MG tablet Take 400 mg by mouth every 6 (six) hours as needed for pain.    Historical Provider, MD  pantoprazole (PROTONIX) 40 MG tablet Take 1 tablet (40 mg total) by mouth daily. Take 30-60 min before first meal of the day 06/15/13   Nyoka Cowden, MD  predniSONE (DELTASONE) 10 MG tablet Take  4 each am x 2 days,   2 each am x 2 days,  1 each am x 2 days and stop 06/15/13   Nyoka Cowden, MD   There were no vitals taken for this visit. Physical Exam  Nursing note and vitals reviewed. Constitutional: He appears distressed.  Sitting on corner of bed. Tripoding. Obvious respiratory distress.   HENT:  Head: Normocephalic and atraumatic.  Eyes: Conjunctivae are normal. Right eye exhibits no discharge. Left eye exhibits no discharge.  Neck: Neck supple.  Cardiovascular: Regular rhythm and normal heart sounds.  Exam reveals no gallop and no friction rub.   No murmur heard. tachycardic  Pulmonary/Chest: He is in respiratory distress. He has wheezes.  B/l wheezing. Tachypneic. Prolonged expiratory phase.   Abdominal: Soft. He exhibits no distension. There is no tenderness.  Musculoskeletal: He exhibits no edema and no tenderness.  Neurological:  Awake. Follows commands. Questions he does  answer are with appropriate responses.   Skin: Skin is warm. He is diaphoretic.  Psychiatric:  Anxious    ED Course  OG placement Date/Time: 12/15/2013 9:05 AM Performed by: Raeford RazorKOHUT, Ardine Iacovelli Authorized by: Raeford RazorKOHUT, Stormie Ventola Consent: The procedure was performed in an emergent situation. Patient identity confirmed: arm band and provided demographic data Time out: Immediately prior to procedure a "time out" was called to verify the correct patient, procedure, equipment, support staff and  site/side marked as required. Local anesthesia used: no Patient sedated: yes Sedatives: ketamine Vitals: Vital signs were monitored during sedation. Patient tolerance: Patient tolerated the procedure well with no immediate complications. Comments: OGT was coiled in mouth. Replaced and confirmed placement with auscultation of air bolus.    (including critical care time)  INTUBATION Performed by: Raeford RazorKOHUT, Adrianna Dudas  Required items: required blood products, implants, devices, and special equipment available Patient identity confirmed: provided demographic data and hospital-assigned identification number Time out: Immediately prior to procedure a "time out" was called to verify the correct patient, procedure, equipment, support staff and site/side marked as required.  Indications: respiratory failure  Intubation method: Direct Laryngoscopy, Mac 4  Preoxygenation: NRB, passive oxygenation during laryngoscopy  Sedatives: ketamine Paralytic: rocuronium  Tube Size: 8.0 cuffed  Post-procedure assessment: chest rise and ETCO2 monitor Breath sounds: equal and absent over the epigastrium Tube secured with: ETT holder Chest x-ray interpreted by radiologist and me.  Chest x-ray findings: endotracheal tube in appropriate position. OGT coiled in mouth.   Patient tolerated the procedure well with no immediate complications.  CRITICAL CARE Performed by: Raeford RazorKOHUT, Emett Stapel  Total critical care time: 50 minutes  Critical care time was exclusive of separately billable procedures and treating other patients. Critical care was necessary to treat or prevent imminent or life-threatening deterioration. Critical care was time spent personally by me on the following activities: development of treatment plan with patient and/or surrogate as well as nursing, discussions with consultants, evaluation of patient's response to treatment, examination of patient, obtaining history from patient or surrogate, ordering  and performing treatments and interventions, ordering and review of laboratory studies, ordering and review of radiographic studies, pulse oximetry and re-evaluation of patient's condition.   Labs Review Labs Reviewed  CBC WITH DIFFERENTIAL - Abnormal; Notable for the following:    Monocytes Absolute 1.1 (*)    Eosinophils Relative 6 (*)    All other components within normal limits  BLOOD GAS, ARTERIAL - Abnormal; Notable for the following:    pH, Arterial 7.290 (*)    pCO2 arterial 55.3 (*)    Bicarbonate 25.7 (*)    All other components within normal limits  BASIC METABOLIC PANEL    Imaging Review Dg Chest Portable 1 View  12/15/2013   CLINICAL DATA:  Respiratory distress.  Cough.  EXAM: PORTABLE CHEST - 1 VIEW  COMPARISON:  03/11/2013  FINDINGS: Artifact overlies the chest. There is left ventricular prominence. Mediastinal shadows show some calcification and unfolding of the aorta. The initial portable film shows a poorer inspiration. The lung bases are not included. Repeat study of the bases does not show any infiltrate, collapse or effusion.  IMPRESSION: No active disease. Left ventricular prominence. Poor inspiration on the initial film.   Electronically Signed   By: Paulina FusiMark  Shogry M.D.   On: 12/15/2013 08:40     EKG Interpretation None      EKG:  Rhythm: sinus tachycardia with PVC Rate: 107 Intervals: normal Axis: normal ST segments: artifact/wander impair interpretation but ST segments seems normal  MDM   Final diagnoses:  Respiratory failure, unspecified chronicity, unspecified whether with hypoxia or hypercapnia  COPD exacerbation    8:09 AM Pt in respiratory distress. Hx of COPD. Suspect exacerbation of this. Diffuse wheezing. Sitting on corner of bed tripoding. Diaphoretic. Some questions he doesn't attempt to even answer. The ones he does are in one word responses. Given 125 mg solumedrol and 5/0.5 albuterol/atrovent prior to arrival. Continued nebs. Magnesium.  Denies hx of heart disease. Terbutaline. Pt apparently didn't tolerate face mask for EMS. Will give does of ativan. Empiric abx. CXR. ABG. o2 saturations are currently ok. Will monitor for response to treatment at this point.  8:36 AM o2 sats remain ok but no significant change in WOB. Suspect will end up having to intubate.   Raeford Razor, MD 12/15/13 [phone]

## 2013-12-15 NOTE — Progress Notes (Signed)
PHARMACY BRIEF NOTE:   LOVENOX FOR VTE PROPHYLAXIS  58 y/o M with respiratory failure due to AECOPD.  NKDA Wt 106.6kg SCr 0.7, estimated CrCl > 100 mL/min  Hgb 14.5 Hct 42.6 Pltc 324K   Plan: 1. Begin Lovenox 50 mg (~0.5mg /kg) SQ q24h 2. Follow SCr, H/H, pltc, clinical course.  Elie Goodyandy Giulliana Mcroberts, PharmD, BCPS Pager: [phone] 12/15/2013  11:56 AM

## 2013-12-15 NOTE — ED Notes (Addendum)
Unused RSI medications and remainder of Ketamine given to Roger Fields, [employer], to return

## 2013-12-15 NOTE — Progress Notes (Signed)
Echo Lab  2D Echocardiogram completed.  Kristofer Schaffert L Krupa Stege, RDCS 12/15/2013 3:11 PM

## 2013-12-15 NOTE — H&P (Signed)
PULMONARY / CRITICAL CARE MEDICINE   Name: Roger Fields MRN: [id number] DOB: 1955/12/17    ADMISSION DATE:  12/15/2013 CONSULTATION DATE: 12/15/13  PRIMARY SERVICE: PCCM  CHIEF COMPLAINT:  Shortness of breath  BRIEF PATIENT DESCRIPTION:  58 y.o. Male with h/o COPD, asthma and htn who presented 7/16 with progressive SOB.  He was intubated in the ED d/t respiratory distress. PCCM consulted.  SIGNIFICANT EVENTS / STUDIES:  716 Intubated in ED 7/16 Admitted for acute respiratory failure  LINES / TUBES: OETT 7/16 >>> Foley 7/16 >>>  CULTURES: Blood culture 7/16 >>> Sputum 7/16 >>>  ANTIBIOTICS: Levaquin 7/16 >>>  HISTORY OF PRESENT ILLNESS: 58 y.o. Male with h/o COPD, asthma and htn who presented to [facility] on 7/16 with SOB x1 day. He was intubated in the ED d/t respiratory distress. PCCM consulted.  Of note, he saw Dr. Sherene Sires 06/2013 for COPD but did not follow up. No PFT's done. Had c/o worsening SOB over 1 year. He is also a current smoker, and does not have oxygen at home.   PAST MEDICAL HISTORY :  Past Medical History  Diagnosis Date  . Asthma   . COPD (chronic obstructive pulmonary disease)   . Hypertension    No past surgical history on file. Prior to Admission medications   Medication Sig Start Date End Date Taking? Authorizing Provider  albuterol (PROVENTIL HFA;VENTOLIN HFA) 108 (90 BASE) MCG/ACT inhaler Inhale 2 puffs into the lungs every 4 (four) hours as needed for wheezing. 06/15/13   Nyoka Cowden, MD  albuterol (PROVENTIL) (2.5 MG/3ML) 0.083% nebulizer solution Take 2.5 mg by nebulization every 6 (six) hours as needed for wheezing or shortness of breath.    Historical Provider, MD  budesonide-formoterol (SYMBICORT) 160-4.5 MCG/ACT inhaler Inhale 2 puffs into the lungs 2 (two) times daily.    Historical Provider, MD  famotidine (PEPCID) 20 MG tablet One at bedtime 06/15/13   Nyoka Cowden, MD  Hydrocodone-Acetaminophen (VICODIN) 5-300 MG TABS One every 4 hours  as needed for back pain 06/15/13   Nyoka Cowden, MD  ibuprofen (ADVIL,MOTRIN) 200 MG tablet Take 400 mg by mouth every 6 (six) hours as needed for pain.    Historical Provider, MD  pantoprazole (PROTONIX) 40 MG tablet Take 1 tablet (40 mg total) by mouth daily. Take 30-60 min before first meal of the day 06/15/13   Nyoka Cowden, MD  predniSONE (DELTASONE) 10 MG tablet Take  4 each am x 2 days,   2 each am x 2 days,  1 each am x 2 days and stop 06/15/13   Nyoka Cowden, MD   No Known Allergies  FAMILY HISTORY:  Family History  Problem Relation Age of Onset  . Heart disease Mother    SOCIAL HISTORY:  reports that he has been smoking Cigarettes.  He has a 40 pack-year smoking history. He has never used smokeless tobacco. He reports that he drinks about .6 ounces of alcohol per week. He reports that he does not use illicit drugs.  REVIEW OF SYSTEMS: Intubated and sedated  SUBJECTIVE:   VITAL SIGNS: Pulse Rate:  [104-122] 104 (07/16 0945) Resp:  [23-34] 34 (07/16 0945) BP: (147-224)/(84-124) 147/84 mmHg (07/16 0945) SpO2:  [96 %-100 %] 100 % (07/16 0945) FiO2 (%):  [100 %] 100 % (07/16 0900) Weight:  [235 lb (106.595 kg)] 235 lb (106.595 kg) (07/16 0810) HEMODYNAMICS:   VENTILATOR SETTINGS: Vent Mode:  [-] PRVC FiO2 (%):  [100 %] 100 % Set  Rate:  [15 bmp] 15 bmp Vt Set:  [640 mL] 640 mL PEEP:  [5 cmH20] 5 cmH20 Plateau Pressure:  [19 cmH20] 19 cmH20 INTAKE / OUTPUT: Intake/Output   None    PHYSICAL EXAMINATION: General: acutely ill appearing obese male  Neuro: sedated  HEENT: Intubated Cardiovascular: tachy, regular, no murmurs Lungs: Inspiratory and expiratory wheeze, with prolong exhalation Abdomen: Soft, non-distended, non-tender Musculoskeletal: no deformity Skin: intact  LABS:  CBC  Recent Labs Lab 12/15/13 0826  WBC 10.5  HGB 14.5  HCT 42.6  PLT 324   Coag's No results found for this basename: APTT, INR,  in the last 168 hours BMET  Recent Labs Lab  12/15/13 0826  NA 138  K 3.8  CL 100  CO2 24  BUN 9  CREATININE 0.71  GLUCOSE 117*   Electrolytes  Recent Labs Lab 12/15/13 0826  CALCIUM 9.1   Sepsis Markers No results found for this basename: LATICACIDVEN, PROCALCITON, O2SATVEN,  in the last 168 hours ABG  Recent Labs Lab 12/15/13 0833  PHART 7.290*  PCO2ART 55.3*  PO2ART 99.8   Liver Enzymes No results found for this basename: AST, ALT, ALKPHOS, BILITOT, ALBUMIN,  in the last 168 hours Cardiac Enzymes No results found for this basename: TROPONINI, PROBNP,  in the last 168 hours Glucose No results found for this basename: GLUCAP,  in the last 168 hours  Imaging Dg Chest Portable 1 View  12/15/2013   CLINICAL DATA:  Respiratory distress.  EXAM: PORTABLE CHEST - 1 VIEW  COMPARISON:  Same day.  FINDINGS: The heart size and mediastinal contours are within normal limits. Endotracheal tube is in grossly good position with distal tip 5.6 cm above the carina. No pneumothorax or pleural effusion is noted. Both lungs are clear. The visualized skeletal structures are unremarkable.  IMPRESSION: Endotracheal tube in grossly good position. No acute cardiopulmonary abnormality seen.   Electronically Signed   By: Roque LiasJames  Green M.D.   On: 12/15/2013 09:33   Dg Chest Portable 1 View  12/15/2013   CLINICAL DATA:  Respiratory distress.  Cough.  EXAM: PORTABLE CHEST - 1 VIEW  COMPARISON:  03/11/2013  FINDINGS: Artifact overlies the chest. There is left ventricular prominence. Mediastinal shadows show some calcification and unfolding of the aorta. The initial portable film shows a poorer inspiration. The lung bases are not included. Repeat study of the bases does not show any infiltrate, collapse or effusion.  IMPRESSION: No active disease. Left ventricular prominence. Poor inspiration on the initial film.   Electronically Signed   By: Paulina FusiMark  Shogry M.D.   On: 12/15/2013 08:40   CXR 7/16: Poor film with cut off bases, mild pulm edema, but no  evidence of infiltrate   ASSESSMENT / PLAN:  PULMONARY A:  Acute respiratory failure second to COPD exacerbation Acute respiratory acidosis Mild pulm edema NOTE - POS for amphetamines on UDS  P:    Full vent support - decreased RR r/t prolonged expiration - may need to change again based on f/u ABG  WUA/SBT Consider lasix x 1 - hold for now with soft BP  Duonebs q6, PRN albuterol  Started Solumedrol 80mg  q8h  F/u ABG F/u CXR for ETT placement and in am   CARDIOVASCULAR A:  Acute respiratory failure H/o Hypertension Tachycardia  P:  PRN labetalol  F/u troponin, BNP Ordered ECHO  RENAL A:   No active issues - monitor d/t lasix P:   KVO IVF for now  F/u CMP  GASTROINTESTINAL A:  H/o GERD  Nutrition P:   Start Protonix, pepcid (home rx)  Insert OG tubes - begin TFs tomorrow if unable to extubate  HEMATOLOGIC A:   No active issues P:  F/u CBC SCDs Lovenox VTE prophylaxis   INFECTIOUS A:  No leukocytosis, no infiltrates P:   F/u Blood cx, sputum cx, urinanlysis Ct Levaquin - consider discontinuing if clinically improves  ENDOCRINE A:  No active issues P:   F/u Hgb A1C, TSH  F/u glucose on BMP  NEUROLOGIC A:   Acute encephalopathy related to acute respiratory failure Chronic pain P:   Propofol for sedation Fentanyl PRN RASS goal: -1  TODAY'S SUMMARY: Patient hemodynamically stable at this time. Appears to be acute exacerbation of COPD +/- substance abuse, but will need to be monitored for evolving pneumonia. Is already tolerating lower ventilator settings and may be able to extubate in the AM if clinically improving.  Will f/u ABG in 1 hour.    Elsie Amis ACNP student   I have personally obtained a history, examined the patient, evaluated laboratory and imaging results, formulated the assessment and plan and placed orders. CRITICAL CARE: The patient is critically ill with multiple organ systems failure and requires high complexity decision  making for assessment and support, frequent evaluation and titration of therapies, application of advanced monitoring technologies and extensive interpretation of multiple databases. Critical Care Time devoted to patient care services described in this note is 60  minutes.    Dirk Dress, NP 12/15/2013  11:07 AM Pager: [phone] or [phone]  *Care during the described time interval was provided by me and/or other providers on the critical care team. I have reviewed this patient's available data, including medical history, events of note, physical examination and test results as part of my evaluation.   Levy Pupa, MD, PhD 12/15/2013, 5:36 PM [facility] [phone] or if no answer [phone]

## 2013-12-15 NOTE — Progress Notes (Signed)
[id number]/Rhonda Earlene Plateravis, RN, BSN, CCM:                                                                  CHART REVIEWED AND UPDATED.  Next chart review due on [id number]. NO DISCHARGE NEEDS PRESENT AT THIS TIME WILL CONTINUE TO FOLLOW.   CASE MANAGEMENT [phone]

## 2013-12-15 NOTE — ED Notes (Signed)
Bed: RESA Expected date:  Expected time:  Means of arrival:  Comments: EMS- respiratory distress  

## 2013-12-15 NOTE — Progress Notes (Signed)
ANTIBIOTIC CONSULT NOTE - INITIAL  Pharmacy Consult for Levaquin Indication:   No Known Allergies  Patient Measurements: Height: 6\' 1"  (185.4 cm) Weight: 235 lb (106.595 kg) IBW/kg (Calculated) : 79.9   Vital Signs: Temp: 97 F (36.1 C) (07/16 0900) Temp src: Core (Comment) (07/16 0900) BP: 112/57 mmHg (07/16 1115) Pulse Rate: 84 (07/16 1145) Intake/Output from previous day:   Intake/Output from this shift:    Labs:  Recent Labs  12/15/13 0826  WBC 10.5  HGB 14.5  PLT 324  CREATININE 0.71   Estimated Creatinine Clearance: 130.6 ml/min (by C-G formula based on Cr of 0.71).    Microbiology: No results found for this or any previous visit (from the past 720 hour(s)).  Medical History: Past Medical History  Diagnosis Date  . Asthma   . COPD (chronic obstructive pulmonary disease)   . Hypertension     Medications:  Scheduled:  . ipratropium-albuterol  3 mL Nebulization Q6H  . lidocaine (cardiac) 100 mg/545ml      . methylPREDNISolone (SOLU-MEDROL) injection  80 mg Intravenous Q8H  . pantoprazole (PROTONIX) IV  40 mg Intravenous QHS  . succinylcholine       Infusions:  . sodium chloride    . famotidine (PEPCID) IV    . propofol     PRN: albuterol, fentaNYL, labetalol  Assessment: 58 y/o M with acute respiratory failure secondary to COPD exacerbation.  Orders received to begin Levaquin with pharmacy dosing assistance.  Goal of Therapy:  Appropriate antibiotic dosing for renal function; eradication of infection.   Plan:  1. Levaquin 750 mg IV q24h (first dose given in ED) 2. Follow renal function, cultures, clinical course.  Elie Goodyandy Tip Atienza, PharmD, BCPS Pager: [phone] 12/15/2013  11:52 AM

## 2013-12-16 ENCOUNTER — Inpatient Hospital Stay (HOSPITAL_COMMUNITY): Payer: PRIVATE HEALTH INSURANCE

## 2013-12-16 LAB — BLOOD GAS, ARTERIAL
Acid-Base Excess: 1.9 mmol/L (ref 0.0–2.0)
Bicarbonate: 23.6 mEq/L (ref 20.0–24.0)
Drawn by: 232811
FIO2: 0.4 %
O2 Saturation: 96.3 %
PATIENT TEMPERATURE: 36.2
PCO2 ART: 43.7 mmHg (ref 35.0–45.0)
PEEP/CPAP: 5 cmH2O
PH ART: 7.347 — AB (ref 7.350–7.450)
RATE: 16 resp/min
TCO2: 21.6 mmol/L (ref 0–100)
VT: 600 mL
pO2, Arterial: 82.8 mmHg (ref 80.0–100.0)

## 2013-12-16 LAB — COMPREHENSIVE METABOLIC PANEL
ALBUMIN: 3.3 g/dL — AB (ref 3.5–5.2)
ALK PHOS: 77 U/L (ref 39–117)
ALT: 9 U/L (ref 0–53)
AST: 11 U/L (ref 0–37)
Anion gap: 11 (ref 5–15)
BILIRUBIN TOTAL: 0.2 mg/dL — AB (ref 0.3–1.2)
BUN: 18 mg/dL (ref 6–23)
CHLORIDE: 103 meq/L (ref 96–112)
CO2: 23 mEq/L (ref 19–32)
Calcium: 8.8 mg/dL (ref 8.4–10.5)
Creatinine, Ser: 1.16 mg/dL (ref 0.50–1.35)
GFR calc Af Amer: 79 mL/min — ABNORMAL LOW (ref >90)
GFR calc non Af Amer: 68 mL/min — ABNORMAL LOW (ref >90)
Glucose, Bld: 149 mg/dL — ABNORMAL HIGH (ref 70–99)
POTASSIUM: 4.4 meq/L (ref 3.7–5.3)
SODIUM: 137 meq/L (ref 137–147)
Total Protein: 6.2 g/dL (ref 6.0–8.3)

## 2013-12-16 LAB — CBC
HEMATOCRIT: 35.5 % — AB (ref 39.0–52.0)
Hemoglobin: 12 g/dL — ABNORMAL LOW (ref 13.0–17.0)
MCH: 30.7 pg (ref 26.0–34.0)
MCHC: 33.8 g/dL (ref 30.0–36.0)
MCV: 90.8 fL (ref 78.0–100.0)
Platelets: 267 10*3/uL (ref 150–400)
RBC: 3.91 MIL/uL — ABNORMAL LOW (ref 4.22–5.81)
RDW: 14.1 % (ref 11.5–15.5)
WBC: 10.3 10*3/uL (ref 4.0–10.5)

## 2013-12-16 LAB — MAGNESIUM: Magnesium: 2.5 mg/dL (ref 1.5–2.5)

## 2013-12-16 LAB — PHOSPHORUS: Phosphorus: 3.7 mg/dL (ref 2.3–4.6)

## 2013-12-16 MED ORDER — DEXMEDETOMIDINE HCL IN NACL 400 MCG/100ML IV SOLN
0.4000 ug/kg/h | INTRAVENOUS | Status: DC
  Administered 2013-12-16 (×6): 1.2 ug/kg/h via INTRAVENOUS
  Administered 2013-12-16: 0.6 ug/kg/h via INTRAVENOUS
  Administered 2013-12-16 – 2013-12-17 (×2): 1.2 ug/kg/h via INTRAVENOUS
  Administered 2013-12-17: 0.7 ug/kg/h via INTRAVENOUS
  Filled 2013-12-16: qty 100
  Filled 2013-12-16 (×3): qty 50
  Filled 2013-12-16: qty 100
  Filled 2013-12-16 (×2): qty 50
  Filled 2013-12-16 (×2): qty 100
  Filled 2013-12-16 (×2): qty 50

## 2013-12-16 MED ORDER — PROPOFOL 10 MG/ML IV EMUL
INTRAVENOUS | Status: AC
  Filled 2013-12-16: qty 100

## 2013-12-16 MED ORDER — METHYLPREDNISOLONE SODIUM SUCC 125 MG IJ SOLR
80.0000 mg | Freq: Two times a day (BID) | INTRAMUSCULAR | Status: DC
  Administered 2013-12-16 – 2013-12-17 (×2): 80 mg via INTRAVENOUS
  Filled 2013-12-16 (×2): qty 2

## 2013-12-16 MED ORDER — FAMOTIDINE IN NACL 20-0.9 MG/50ML-% IV SOLN
20.0000 mg | Freq: Two times a day (BID) | INTRAVENOUS | Status: DC
  Administered 2013-12-16 (×2): 20 mg via INTRAVENOUS
  Filled 2013-12-16 (×2): qty 50

## 2013-12-16 NOTE — Progress Notes (Signed)
INITIAL NUTRITION ASSESSMENT  DOCUMENTATION CODES Per approved criteria  -Not Applicable   INTERVENTION: - If pt unable to be extubated in the next 24-48 hours, recommend TF initiation of Vital AF 1.2 start at 40ml/hr increase by 10ml every 4 hours to goal of 29ml/hr. Goal rate will provide 1728 calories, 108g protein, and free water. Goal rate of TF plus current calories provided by Propofol will provide a total of 2235 calories and meet 103% estimated calorie needs, 98% estimated protein needs. - Recommend initiation of adult enteral protocol once TF started - RD to continue to monitor   NUTRITION DIAGNOSIS: Inadequate oral intake related to inability to eat as evidenced by NPO.   Goal: Diet advancement once extubated, otherwise TF to meet >90% of estimated nutritional needs while on vent  Monitor:  Weights, labs, vent status  Reason for Assessment: Ventilated pt   58 y.o. male  Admitting Dx: Respiratory distress  ASSESSMENT: [place] c/o respiratory distress starting around 0300. Hx of asthma, COPD, and HTN. Pt apparently didn't tolerate face mask for EMS. Was intubated in the ED r/t respiratory distress. Noted plans for possible extubation. Per conversation with NP, plan is for RD to leave TF recommendations in case pt remains intubated.   Patient is currently intubated on ventilator support MV: 8.1 L/min Temp (24hrs), Avg:97.8 F (36.6 C), Min:93.7 F (34.3 C), Max:100 F (37.8 C)  Propofol: 19.2 ml/hr - provides 507 calories    Height: Ht Readings from Last 1 Encounters:  12/15/13 6\' 1"  (1.854 m)    Weight: Wt Readings from Last 1 Encounters:  12/16/13 224 lb 6.9 oz (101.8 kg)    Ideal Body Weight: 184 lbs  % Ideal Body Weight: 122%  Wt Readings from Last 10 Encounters:  12/16/13 224 lb 6.9 oz (101.8 kg)  06/15/13 235 lb (106.595 kg)  03/11/13 225 lb 5 oz (102.2 kg)    Usual Body Weight: 235 lbs in  January 2015  % Usual Body Weight: 95%  BMI:  Body mass index is 29.62 kg/(m^2).  Estimated Nutritional Needs: Kcal: 2176 Protein: 110-130g Fluid: per MD  Skin: Intact  Diet Order:  NPO  EDUCATION NEEDS: -No education needs identified at this time   Intake/Output Summary (Last 24 hours) at 12/16/13 0945 Last data filed at 12/16/13 0800  Gross per 24 hour  Intake 874.65 ml  Output   1300 ml  Net -425.35 ml    Last BM: PTA  Labs:   Recent Labs Lab 12/15/13 0826 12/15/13 1117 12/16/13 0316  NA 138  --  137  K 3.8  --  4.4  CL 100  --  103  CO2 24  --  23  BUN 9  --  18  CREATININE 0.71  --  1.16  CALCIUM 9.1  --  8.8  MG  --  2.0 2.5  PHOS  --  3.4 3.7  GLUCOSE 117*  --  149*    CBG (last 3)  No results found for this basename: GLUCAP,  in the last 72 hours  Scheduled Meds: . antiseptic oral rinse  15 mL Mouth Rinse QID  . chlorhexidine  15 mL Mouth Rinse BID  . enoxaparin (LOVENOX) injection  50 mg Subcutaneous Q24H  . famotidine (PEPCID) IV  20 mg Intravenous Q12H  . ipratropium-albuterol  3 mL Nebulization Q6H  . levofloxacin (LEVAQUIN) IV  750 mg Intravenous Q24H  . methylPREDNISolone (SOLU-MEDROL) injection  80  mg Intravenous Q12H    Continuous Infusions: . sodium chloride 10 mL/hr at 12/15/13 1300  . dexmedetomidine    . fentaNYL infusion INTRAVENOUS Stopped (12/16/13 0845)  . propofol 30 mcg/kg/min (12/16/13 0830)    Past Medical History  Diagnosis Date  . Asthma   . COPD (chronic obstructive pulmonary disease)   . Hypertension     History reviewed. No pertinent past surgical history.   Charlott RakesHeather Shunte Senseney MS, RD, LDN [phone] Pager [phone] Weekend/After Hours Pager

## 2013-12-16 NOTE — Progress Notes (Signed)
PULMONARY / CRITICAL CARE MEDICINE   Name: Roger Fields MRN: [id number] DOB: 1955-07-27    ADMISSION DATE:  12/15/2013 CONSULTATION DATE: 12/15/13  PRIMARY SERVICE: PCCM  CHIEF COMPLAINT:  Shortness of breath  BRIEF PATIENT DESCRIPTION:  58 y.o. Male with h/o COPD, asthma and htn who presented 7/16 with progressive SOB.  He was intubated in the ED d/t respiratory distress. PCCM consulted.  SIGNIFICANT EVENTS / STUDIES:  716 Intubated in ED 7/16 Admitted for acute respiratory failure 7/16 echo: EF 65-70%, gd I D dysfxn, mod LVH, LA mildly dilated.  LINES / TUBES: OETT 7/16 >>> Foley 7/16 >>>  CULTURES: Blood culture 7/16 >>> Sputum 7/16 >>>  ANTIBIOTICS: Levaquin 7/16 >>>   SUBJECTIVE:  Agitated during wake-up assessment but F/Vt vent mechanics while sedated suggest he could come off vent  VITAL SIGNS: Temp:  [93.7 F (34.3 C)-100 F (37.8 C)] 99 F (37.2 C) (07/17 0800) Pulse Rate:  [71-125] 77 (07/17 0800) Resp:  [10-34] 16 (07/17 0800) BP: (78-228)/(45-132) 115/66 mmHg (07/17 0800) SpO2:  [90 %-100 %] 94 % (07/17 0800) FiO2 (%):  [40 %-100 %] 40 % (07/17 0826) Weight:  [101.8 kg (224 lb 6.9 oz)] 101.8 kg (224 lb 6.9 oz) (07/17 0109) HEMODYNAMICS:   VENTILATOR SETTINGS: Vent Mode:  [-] PSV;CPAP FiO2 (%):  [40 %-100 %] 40 % Set Rate:  [12 bmp-16 bmp] 16 bmp Vt Set:  [600 mL-640 mL] 600 mL PEEP:  [5 cmH20] 5 cmH20 Pressure Support:  [5 cmH20] 5 cmH20 Plateau Pressure:  [14 cmH20-19 cmH20] 18 cmH20 INTAKE / OUTPUT: Intake/Output     07/16 0701 - 07/17 0700 07/17 0701 - 07/18 0700   I.V. (mL/kg) 776.3 (7.6)    IV Piggyback 50    Total Intake(mL/kg) 826.3 (8.1)    Urine (mL/kg/hr) 1300    Total Output 1300     Net -473.8           PHYSICAL EXAMINATION: General: acutely on chronically ill appearing obese male  Neuro: sedated, agitated at times  HEENT: Intubated, no JVD  Cardiovascular: tachy, regular, no murmurs Lungs: Inspiratory and expiratory  wheeze, with prolong exhalation, improved  Abdomen: Soft, non-distended, non-tender Musculoskeletal: no deformity Skin: intact  LABS:  CBC  Recent Labs Lab 12/15/13 0826 12/16/13 0316  WBC 10.5 10.3  HGB 14.5 12.0*  HCT 42.6 35.5*  PLT 324 267   Coag's No results found for this basename: APTT, INR,  in the last 168 hours BMET  Recent Labs Lab 12/15/13 0826 12/16/13 0316  NA 138 137  K 3.8 4.4  CL 100 103  CO2 24 23  BUN 9 18  CREATININE 0.71 1.16  GLUCOSE 117* 149*   Electrolytes  Recent Labs Lab 12/15/13 0826 12/15/13 1117 12/16/13 0316  CALCIUM 9.1  --  8.8  MG  --  2.0 2.5  PHOS  --  3.4 3.7   Sepsis Markers No results found for this basename: LATICACIDVEN, PROCALCITON, O2SATVEN,  in the last 168 hours ABG  Recent Labs Lab 12/15/13 1237 12/15/13 1615 12/16/13 0400  PHART 7.262* 7.396 7.347*  PCO2ART 48.9* 37.5 43.7  PO2ART 393.0* 73.2* 82.8   Liver Enzymes  Recent Labs Lab 12/16/13 0316  AST 11  ALT 9  ALKPHOS 77  BILITOT 0.2*  ALBUMIN 3.3*   Cardiac Enzymes  Recent Labs Lab 12/15/13 1117 12/15/13 1215 12/15/13 2258  TROPONINI <0.30 <0.30 <0.30  PROBNP 192.9*  --   --    Glucose No results found  for this basename: GLUCAP,  in the last 168 hours  Imaging Dg Chest Port 1 View  12/16/2013   CLINICAL DATA:  Shortness of breath.  COPD.  EXAM: PORTABLE CHEST - 1 VIEW  COMPARISON:  Single view of the chest 12/15/2013. CT chest 03/11/2013.  FINDINGS: FINDINGS  The the patient has an NG tube coursing into the stomach and below the inferior margin of the film. ET tube is in place with the tip in good position at the level of the clavicular heads. Minimal discoid atelectasis is seen in the left mid lung zone. Lungs otherwise clear Heart size is normal. No pneumothorax or pleural effusion.  IMPRESSION: Support apparatus projects in good position.  Minimal discoid atelectasis left midlung zone.   Electronically Signed   By: Drusilla Kanner  M.D.   On: 12/16/2013 07:24   Dg Chest Portable 1 View  12/15/2013   CLINICAL DATA:  Respiratory distress.  EXAM: PORTABLE CHEST - 1 VIEW  COMPARISON:  Same day.  FINDINGS: The heart size and mediastinal contours are within normal limits. Endotracheal tube is in grossly good position with distal tip 5.6 cm above the carina. No pneumothorax or pleural effusion is noted. Both lungs are clear. The visualized skeletal structures are unremarkable.  IMPRESSION: Endotracheal tube in grossly good position. No acute cardiopulmonary abnormality seen.   Electronically Signed   By: Roque Lias M.D.   On: 12/15/2013 09:33   Dg Chest Portable 1 View  12/15/2013   CLINICAL DATA:  Respiratory distress.  Cough.  EXAM: PORTABLE CHEST - 1 VIEW  COMPARISON:  03/11/2013  FINDINGS: Artifact overlies the chest. There is left ventricular prominence. Mediastinal shadows show some calcification and unfolding of the aorta. The initial portable film shows a poorer inspiration. The lung bases are not included. Repeat study of the bases does not show any infiltrate, collapse or effusion.  IMPRESSION: No active disease. Left ventricular prominence. Poor inspiration on the initial film.   Electronically Signed   By: Paulina Fusi M.D.   On: 12/15/2013 08:40   CXR 7/16: rotated. Basilar atx   ASSESSMENT / PLAN:  PULMONARY A:  Acute respiratory failure second to COPD exacerbation Acute respiratory acidosis Mild pulm edema NOTE - POS for amphetamines on UDS  CXR w/ basilar atx  P:    Cont daily wake-up assessment and weaning trials-->may be able to extubate later 7/17 IF agitation can be controlled w/ precedex.  Resume full support at HS if not extubated  Duonebs q6, PRN albuterol  Change solumedrol to q 12  F/u ABG F/u CXR for ETT placement and in am   CARDIOVASCULAR A:  H/o Hypertension Tachycardia  Gd I D. Dysfxn/ nml EF 65-70% Trops neg  P:  PRN labetalol  Cont tele  RENAL A:   No active issues - monitor  d/t lasix P:   KVO IVF for now  F/u CMP  GASTROINTESTINAL A:   H/o GERD  Nutrition P:   Start Protonix, pepcid (home rx)  begin TFs tomorrow if unable to extubate later today 7/17  HEMATOLOGIC A:   No active issues P:  F/u CBC Lovenox VTE prophylaxis   INFECTIOUS A:  AECOPD P:   F/u Blood cx, sputum cx, urinanlysis Ct Levaquin - consider discontinuing if clinically improves  ENDOCRINE A:  No active issues P:   F/u glucose on BMP  NEUROLOGIC A:   Acute encephalopathy related to acute respiratory failure Chronic pain Agitated during wake-up assessment. Not sure if this  is drug or med related. His lung mechanics while on propofol would suggest agitation not exacerbated by breathing currently. P:   Propofol for sedation--> will try to transition to precedex Fentanyl gtt--> convert to PRN RASS goal: -1  TODAY'S SUMMARY: Patient hemodynamically stable at this time. Vent mechanics suggest he could come off vent. Agitation primary issue. Will try precedex     I have personally obtained a history, examined the patient, evaluated laboratory and imaging results, formulated the assessment and plan and placed orders. CRITICAL CARE: The patient is critically ill with multiple organ systems failure and requires high complexity decision making for assessment and support, frequent evaluation and titration of therapies, application of advanced monitoring technologies and extensive interpretation of multiple databases. Critical Care Time devoted to patient care services described in this note is 40  minutes.     *Care during the described time interval was provided by me and/or other providers on the critical care team. I have reviewed this patient's available data, including medical history, events of note, physical examination and test results as part of my evaluation.  Levy Pupaobert Rosary Filosa, MD, PhD 12/16/2013, 11:11 AM [facility] [phone] or if no answer  [phone]

## 2013-12-16 NOTE — Progress Notes (Signed)
RT drew abg at 0400 this morning, but was unable to electronically transfer abg results into EPIC at this time.  Results are as follows and temp (36.2C) corrected:  pH: 7.347 PCO2: 43.7 PO2: 82.8 PHCO3: 23.6  No panic results noted.  This RT notified Cruzita LedererMike Morgan, RN, of these results.

## 2013-12-16 NOTE — Progress Notes (Signed)
Upon arrival to pt bedside, ett found at 24cm at lip with audible leak.  Post intubation cxr showed ett 5.6cm above carina when documented at 25cm lip.  RT advanced ett to 27cm at lip.  Leak now resolved, and pt getting adequate tidal volumes.  Bilateral breath sounds noted.  Elink doctor notified.  RN aware.

## 2013-12-16 NOTE — Progress Notes (Signed)
Pt bolted upright in bed with 2 RT's at bedside. Pulling at tubes, lines and trying to get OOB. Multiple people holding pt in bed, Diprivan bolus given and restarted at 1610mcg/hr then increased to 20 mcg.

## 2013-12-17 ENCOUNTER — Inpatient Hospital Stay (HOSPITAL_COMMUNITY): Payer: PRIVATE HEALTH INSURANCE

## 2013-12-17 LAB — BLOOD GAS, ARTERIAL
Acid-Base Excess: 0.2 mmol/L (ref 0.0–2.0)
Bicarbonate: 24.9 mEq/L — ABNORMAL HIGH (ref 20.0–24.0)
DRAWN BY: 41977
FIO2: 0.4 %
LHR: 16 {breaths}/min
O2 Saturation: 97.5 %
PCO2 ART: 44.1 mmHg (ref 35.0–45.0)
PEEP/CPAP: 5 cmH2O
PH ART: 7.373 (ref 7.350–7.450)
Patient temperature: 37.5
TCO2: 22.4 mmol/L (ref 0–100)
VT: 600 mL
pO2, Arterial: 102 mmHg — ABNORMAL HIGH (ref 80.0–100.0)

## 2013-12-17 LAB — CBC
HEMATOCRIT: 37.6 % — AB (ref 39.0–52.0)
HEMOGLOBIN: 12.6 g/dL — AB (ref 13.0–17.0)
MCH: 30.4 pg (ref 26.0–34.0)
MCHC: 33.5 g/dL (ref 30.0–36.0)
MCV: 90.8 fL (ref 78.0–100.0)
Platelets: 279 10*3/uL (ref 150–400)
RBC: 4.14 MIL/uL — ABNORMAL LOW (ref 4.22–5.81)
RDW: 13.9 % (ref 11.5–15.5)
WBC: 11.4 10*3/uL — ABNORMAL HIGH (ref 4.0–10.5)

## 2013-12-17 LAB — COMPREHENSIVE METABOLIC PANEL
ALT: 8 U/L (ref 0–53)
ANION GAP: 12 (ref 5–15)
AST: 10 U/L (ref 0–37)
Albumin: 3.4 g/dL — ABNORMAL LOW (ref 3.5–5.2)
Alkaline Phosphatase: 71 U/L (ref 39–117)
BILIRUBIN TOTAL: 0.3 mg/dL (ref 0.3–1.2)
BUN: 34 mg/dL — AB (ref 6–23)
CO2: 25 mEq/L (ref 19–32)
Calcium: 9.1 mg/dL (ref 8.4–10.5)
Chloride: 101 mEq/L (ref 96–112)
Creatinine, Ser: 1.15 mg/dL (ref 0.50–1.35)
GFR calc Af Amer: 80 mL/min — ABNORMAL LOW (ref >90)
GFR calc non Af Amer: 69 mL/min — ABNORMAL LOW (ref >90)
GLUCOSE: 157 mg/dL — AB (ref 70–99)
POTASSIUM: 5 meq/L (ref 3.7–5.3)
Sodium: 138 mEq/L (ref 137–147)
Total Protein: 6.5 g/dL (ref 6.0–8.3)

## 2013-12-17 MED ORDER — ACETAMINOPHEN 325 MG PO TABS: 650.0000 mg | ORAL_TABLET | Freq: Four times a day (QID) | ORAL | Status: DC | PRN

## 2013-12-17 MED ORDER — FAMOTIDINE 20 MG PO TABS
20.0000 mg | ORAL_TABLET | Freq: Two times a day (BID) | ORAL | Status: DC
  Administered 2013-12-17 – 2013-12-19 (×5): 20 mg via ORAL
  Filled 2013-12-17 (×6): qty 1

## 2013-12-17 MED ORDER — FENTANYL CITRATE 0.05 MG/ML IJ SOLN: 25.0000 ug | INTRAMUSCULAR | Status: DC | PRN

## 2013-12-17 MED ORDER — SODIUM CHLORIDE 0.9 % IV SOLN
INTRAVENOUS | Status: DC | PRN
  Administered 2013-12-17: 20:00:00 via INTRAVENOUS

## 2013-12-17 MED ORDER — METHYLPREDNISOLONE SODIUM SUCC 40 MG IJ SOLR
20.0000 mg | Freq: Two times a day (BID) | INTRAMUSCULAR | Status: DC
  Administered 2013-12-17 – 2013-12-18 (×2): 20 mg via INTRAVENOUS
  Filled 2013-12-17 (×2): qty 1

## 2013-12-17 NOTE — Progress Notes (Signed)
PULMONARY / CRITICAL CARE MEDICINE   Name: Nadean Corwinhillip Lynn Lees MRN: [id number] DOB: 03-Dec-1955    ADMISSION DATE:  12/15/2013 CONSULTATION DATE: 12/15/13  CHIEF COMPLAINT:  Shortness of breath  BRIEF PATIENT DESCRIPTION:   58 y.o. Male smoker with h/o COPD, asthma and htn who presented 7/16 with progressive SOB.  He was intubated in the ED d/t respiratory distress. PCCM consulted.  Followed by Dr. Sherene SiresWert.  SIGNIFICANT EVENTS: 7/16 Admitted for acute respiratory failure  STUDIES: 7/16 echo: EF 65-70%, gd I D dysfxn, mod LVH, LA mildly dilated.   LINES / TUBES: OETT 7/16 >>> 7/18  CULTURES: Blood culture 7/16 >>> Sputum 7/16 >>>  ANTIBIOTICS: Levaquin 7/16 >>>  SUBJECTIVE:  Tolerating pressure support.  VITAL SIGNS: Temp:  [97.9 F (36.6 C)-100.4 F (38 C)] 99.9 F (37.7 C) (07/18 0600) Pulse Rate:  [57-94] 57 (07/18 0600) Resp:  [10-97] 16 (07/18 0600) BP: (91-173)/(63-94) 165/92 mmHg (07/18 0600) SpO2:  [92 %-100 %] 97 % (07/18 0600) FiO2 (%):  [40 %] 40 % (07/18 0342) VENTILATOR SETTINGS: Vent Mode:  [-] PRVC FiO2 (%):  [40 %] 40 % Set Rate:  [16 bmp] 16 bmp Vt Set:  [600 mL] 600 mL PEEP:  [5 cmH20] 5 cmH20 Pressure Support:  [5 cmH20] 5 cmH20 Plateau Pressure:  [9 cmH20-18 cmH20] 18 cmH20 INTAKE / OUTPUT: Intake/Output     07/17 0701 - 07/18 0700 07/18 0701 - 07/19 0700   I.V. (mL/kg) 778.8 (7.6)    IV Piggyback 250    Total Intake(mL/kg) 1028.8 (10.1)    Urine (mL/kg/hr) 940 (0.4)    Emesis/NG output 20 (0)    Total Output 960     Net +68.8           PHYSICAL EXAMINATION: General: no distress Neuro: RASS -1 HEENT: ETT in place, poor dentition Cardiovascular: regular, no murmur Lungs: no wheeze Abdomen: Soft, non-tender Musculoskeletal: no edema Skin: no rashes  LABS:  CBC  Recent Labs Lab 12/15/13 0826 12/16/13 0316 12/17/13 0339  WBC 10.5 10.3 11.4*  HGB 14.5 12.0* 12.6*  HCT 42.6 35.5* 37.6*  PLT 324 267 279   BMET  Recent  Labs Lab 12/15/13 0826 12/16/13 0316 12/17/13 0339  NA 138 137 138  K 3.8 4.4 5.0  CL 100 103 101  CO2 24 23 25   BUN 9 18 34*  CREATININE 0.71 1.16 1.15  GLUCOSE 117* 149* 157*   Electrolytes  Recent Labs Lab 12/15/13 0826 12/15/13 1117 12/16/13 0316 12/17/13 0339  CALCIUM 9.1  --  8.8 9.1  MG  --  2.0 2.5  --   PHOS  --  3.4 3.7  --    ABG  Recent Labs Lab 12/15/13 1615 12/16/13 0400 12/17/13 0415  PHART 7.396 7.347* 7.373  PCO2ART 37.5 43.7 44.1  PO2ART 73.2* 82.8 102.0*   Liver Enzymes  Recent Labs Lab 12/16/13 0316 12/17/13 0339  AST 11 10  ALT 9 8  ALKPHOS 77 71  BILITOT 0.2* 0.3  ALBUMIN 3.3* 3.4*   Cardiac Enzymes  Recent Labs Lab 12/15/13 1117 12/15/13 1215 12/15/13 2258  TROPONINI <0.30 <0.30 <0.30  PROBNP 192.9*  --   --    Imaging Dg Chest Port 1 View  12/17/2013   CLINICAL DATA:  Evaluate endotracheal tube  EXAM: PORTABLE CHEST - 1 VIEW  COMPARISON:  Prior chest x-ray 12/16/2013  FINDINGS: The endotracheal tube remains in good position 6 cm above the carina. The tip of the nasogastric tube lies off the  field of view, below the diaphragm and presumably within the stomach. Slightly improved interstitial edema compared to the prior study with decreased discoid atelectasis in the left mid lung. Slightly increased bibasilar opacities favored to reflect areas of shifting atelectasis. Stable cardiac and mediastinal contours. No acute osseous abnormality.  IMPRESSION: 1. Stable and satisfactory support apparatus. 2. Interval resolution of mild interstitial edema. 3. Shifting atelectasis with decreased left mid lung atelectasis and increased bibasilar opacities favored to reflect atelectasis. Early infiltrate in the right lower lobe is difficult to exclude entirely.   Electronically Signed   By: Malachy Moan M.D.   On: 12/17/2013 07:04   Dg Chest Port 1 View  12/16/2013   CLINICAL DATA:  Shortness of breath.  COPD.  EXAM: PORTABLE CHEST - 1 VIEW   COMPARISON:  Single view of the chest 12/15/2013. CT chest 03/11/2013.  FINDINGS: FINDINGS  The the patient has an NG tube coursing into the stomach and below the inferior margin of the film. ET tube is in place with the tip in good position at the level of the clavicular heads. Minimal discoid atelectasis is seen in the left mid lung zone. Lungs otherwise clear Heart size is normal. No pneumothorax or pleural effusion.  IMPRESSION: Support apparatus projects in good position.  Minimal discoid atelectasis left midlung zone.   Electronically Signed   By: Drusilla Kanner M.D.   On: 12/16/2013 07:24   Dg Chest Portable 1 View  12/15/2013   CLINICAL DATA:  Respiratory distress.  EXAM: PORTABLE CHEST - 1 VIEW  COMPARISON:  Same day.  FINDINGS: The heart size and mediastinal contours are within normal limits. Endotracheal tube is in grossly good position with distal tip 5.6 cm above the carina. No pneumothorax or pleural effusion is noted. Both lungs are clear. The visualized skeletal structures are unremarkable.  IMPRESSION: Endotracheal tube in grossly good position. No acute cardiopulmonary abnormality seen.   Electronically Signed   By: Roque Lias M.D.   On: 12/15/2013 09:33   Dg Chest Portable 1 View  12/15/2013   CLINICAL DATA:  Respiratory distress.  Cough.  EXAM: PORTABLE CHEST - 1 VIEW  COMPARISON:  03/11/2013  FINDINGS: Artifact overlies the chest. There is left ventricular prominence. Mediastinal shadows show some calcification and unfolding of the aorta. The initial portable film shows a poorer inspiration. The lung bases are not included. Repeat study of the bases does not show any infiltrate, collapse or effusion.  IMPRESSION: No active disease. Left ventricular prominence. Poor inspiration on the initial film.   Electronically Signed   By: Paulina Fusi M.D.   On: 12/15/2013 08:40    ASSESSMENT / PLAN:  PULMONARY A:  Acute hypoxic/hypercapnic respiratory failure 2nd to  AECOPD. Atelectasis. Tobacco abuse. P:    Proceed with extubation 7/18 Change solumedrol to 20 mg q12h on 7/18 Scheduled BD's Tobacco cessation F/u CXR intermittently Oxygen to keep SpO2 90 to 94% Bronchial hygiene  CARDIOVASCULAR A:  Hx of HTN with chronic diastolic heart failure. Tachycardia >> resolved >> likely from hypoxia. P:  PRN labetalol   RENAL A:   No acute issues. P:   Monitor renal fx, urine outpt, electrolytes D/c foley  GASTROINTESTINAL A:   Nutrition. Hx of GERD. P:   Continue pepcid Resume diet if no respiratory distress after extubation 7/18  HEMATOLOGIC A:   No active issues P:  F/u CBC Lovenox VTE prophylaxis   INFECTIOUS A:  AECOPD. P:   Day 3 levaquin  ENDOCRINE A:  No active issues P:   F/u glucose on BMP  NEUROLOGIC A:   Acute encephalopathy related to acute respiratory failure. Chronic pain. P:   PRN tylenol, fentanyl  CC time 35 minutes.  Coralyn Helling, MD [facility] 12/17/2013, 8:06 AM Pager:  [phone] After 3pm call: [phone]

## 2013-12-17 NOTE — Procedures (Signed)
Extubation Procedure Note  Patient Details:   Name: Roger Fields Jean DOB: April 08, 1956 MRN: [id number]   Airway Documentation:  Pt extubated per MD order. Cuff leak present. Pt placed on 4 L [place]. Pt speaking without difficulty.     Evaluation  O2 sats: stable throughout Complications: No apparent complications Patient did tolerate procedure well. Bilateral Breath Sounds: Clear;Diminished Suctioning: Airway yes Yes  Kendall FlackJackson, Takyia Sindt Royal 12/17/2013, 8:13 AM

## 2013-12-18 LAB — BASIC METABOLIC PANEL
ANION GAP: 10 (ref 5–15)
BUN: 29 mg/dL — ABNORMAL HIGH (ref 6–23)
CALCIUM: 8.8 mg/dL (ref 8.4–10.5)
CO2: 28 mEq/L (ref 19–32)
Chloride: 102 mEq/L (ref 96–112)
Creatinine, Ser: 0.87 mg/dL (ref 0.50–1.35)
Glucose, Bld: 128 mg/dL — ABNORMAL HIGH (ref 70–99)
Potassium: 4.5 mEq/L (ref 3.7–5.3)
Sodium: 140 mEq/L (ref 137–147)

## 2013-12-18 LAB — CBC
HCT: 35.1 % — ABNORMAL LOW (ref 39.0–52.0)
Hemoglobin: 11.7 g/dL — ABNORMAL LOW (ref 13.0–17.0)
MCH: 30.2 pg (ref 26.0–34.0)
MCHC: 33.3 g/dL (ref 30.0–36.0)
MCV: 90.7 fL (ref 78.0–100.0)
PLATELETS: 283 10*3/uL (ref 150–400)
RBC: 3.87 MIL/uL — ABNORMAL LOW (ref 4.22–5.81)
RDW: 14.1 % (ref 11.5–15.5)
WBC: 11.5 10*3/uL — AB (ref 4.0–10.5)

## 2013-12-18 MED ORDER — MOMETASONE FURO-FORMOTEROL FUM 100-5 MCG/ACT IN AERO
2.0000 | INHALATION_SPRAY | Freq: Two times a day (BID) | RESPIRATORY_TRACT | Status: DC
  Administered 2013-12-18 – 2013-12-19 (×3): 2 via RESPIRATORY_TRACT
  Filled 2013-12-18: qty 8.8

## 2013-12-18 MED ORDER — TIOTROPIUM BROMIDE MONOHYDRATE 18 MCG IN CAPS
18.0000 ug | ORAL_CAPSULE | Freq: Every day | RESPIRATORY_TRACT | Status: DC
  Administered 2013-12-18 – 2013-12-19 (×2): 18 ug via RESPIRATORY_TRACT
  Filled 2013-12-18: qty 5

## 2013-12-18 MED ORDER — ALBUTEROL SULFATE (2.5 MG/3ML) 0.083% IN NEBU: 2.5000 mg | INHALATION_SOLUTION | RESPIRATORY_TRACT | Status: DC | PRN

## 2013-12-18 MED ORDER — LEVOFLOXACIN 750 MG PO TABS
750.0000 mg | ORAL_TABLET | Freq: Every day | ORAL | Status: DC
  Administered 2013-12-19: 750 mg via ORAL
  Filled 2013-12-18: qty 1

## 2013-12-18 MED ORDER — PREDNISONE 20 MG PO TABS
20.0000 mg | ORAL_TABLET | Freq: Every day | ORAL | Status: DC
  Administered 2013-12-18 – 2013-12-19 (×2): 20 mg via ORAL
  Filled 2013-12-18 (×3): qty 1

## 2013-12-18 NOTE — Progress Notes (Signed)
ANTIBIOTIC CONSULT NOTE - Follow - up  Pharmacy Consult for Levaquin Indication:   No Known Allergies  Patient Measurements: Height: 6\' 1"  (185.4 cm) Weight: 224 lb 3.3 oz (101.7 kg) IBW/kg (Calculated) : 79.9   Vital Signs: Temp: 97.6 F (36.4 C) (07/19 0800) Temp src: Oral (07/19 0800) BP: 130/67 mmHg (07/19 0600) Pulse Rate: 45 (07/19 0600) Intake/Output from previous day: 07/18 0701 - 07/19 0700 In: 1275.3 [P.O.:930; I.V.:245.3; IV Piggyback:100] Out: 1325 [Urine:1200; Emesis/NG output:125] Intake/Output from this shift: Total I/O In: -  Out: 575 [Urine:575]  Labs:  Recent Labs  12/16/13 0316 12/17/13 0339 12/18/13 0358  WBC 10.3 11.4* 11.5*  HGB 12.0* 12.6* 11.7*  PLT 267 279 283  CREATININE 1.16 1.15 0.87   Estimated Creatinine Clearance: 117.4 ml/min (by C-G formula based on Cr of 0.87).    Microbiology: Recent Results (from the past 720 hour(s))  CULTURE, BLOOD (ROUTINE X 2)     Status: None   Collection Time    12/15/13 11:17 AM      Result Value Ref Range Status   Specimen Description BLOOD RIGHT HAND   Final   Special Requests BOTTLES DRAWN AEROBIC AND ANAEROBIC 5 ML   Final   Culture  Setup Time     Final   Value: 12/15/2013 15:21     Performed at [facility]   Culture     Final   Value:        BLOOD CULTURE RECEIVED NO GROWTH TO DATE CULTURE WILL BE HELD FOR 5 DAYS BEFORE ISSUING A FINAL NEGATIVE REPORT     Performed at [facility]   Report Status PENDING   Incomplete  CULTURE, BLOOD (ROUTINE X 2)     Status: None   Collection Time    12/15/13 11:22 AM      Result Value Ref Range Status   Specimen Description BLOOD RIGHT FOREARM   Final   Special Requests BOTTLES DRAWN AEROBIC AND ANAEROBIC 3 ML   Final   Culture  Setup Time     Final   Value: 12/15/2013 15:21     Performed at [facility]   Culture     Final   Value:        BLOOD CULTURE RECEIVED NO GROWTH TO DATE CULTURE WILL BE HELD FOR 5 DAYS BEFORE ISSUING  A FINAL NEGATIVE REPORT     Performed at [facility]   Report Status PENDING   Incomplete  MRSA PCR SCREENING     Status: None   Collection Time    12/15/13 12:41 PM      Result Value Ref Range Status   MRSA by PCR NEGATIVE  NEGATIVE Final   Comment:            The GeneXpert MRSA Assay (FDA     approved for NASAL specimens     only), is one component of a     comprehensive MRSA colonization     surveillance program. It is not     intended to diagnose MRSA     infection nor to guide or     monitor treatment for     MRSA infections.  CULTURE, RESPIRATORY (NON-EXPECTORATED)     Status: None   Collection Time    12/16/13  8:30 PM      Result Value Ref Range Status   Specimen Description TRACHEAL ASPIRATE   Final   Special Requests Normal   Final   Gram Stain  Final   Value: FEW WBC PRESENT,BOTH PMN AND MONONUCLEAR     RARE SQUAMOUS EPITHELIAL CELLS PRESENT     MODERATE GRAM POSITIVE COCCI IN PAIRS     Performed at [facility]   Culture PENDING   Incomplete   Report Status PENDING   Incomplete    Medical History: Past Medical History  Diagnosis Date  . Asthma   . COPD (chronic obstructive pulmonary disease)   . Hypertension     Medications:  Scheduled:  . antiseptic oral rinse  15 mL Mouth Rinse QID  . chlorhexidine  15 mL Mouth Rinse BID  . enoxaparin (LOVENOX) injection  50 mg Subcutaneous Q24H  . famotidine  20 mg Oral BID  . [START ON 12/19/2013] levofloxacin  750 mg Oral Daily  . mometasone-formoterol  2 puff Inhalation BID  . predniSONE  20 mg Oral Q breakfast  . tiotropium  18 mcg Inhalation Daily   Infusions:    PRN: sodium chloride, acetaminophen, albuterol, fentaNYL, labetalol  Assessment: 58 y/o M with acute respiratory failure secondary to COPD exacerbation.  Orders received to begin Levaquin with pharmacy dosing assistance.  7/16>>Levaquin>>   Tmax: afebrile WBCs: slightly elevated (on steroids) Renal: SCr WNL, CrCl >100  CG  7/16: blood x2: NGTD 7/16: tracheal aspirate: pending  Goal of Therapy:  Appropriate antibiotic dosing for renal function; eradication of infection.  Plan: . Day #4 levofloxacin  Continue Levaquin 750 mg PO q24h (pharmacy will follow at distance as do not expect dose change for renal function, will intervene if dose adjustment becomes necessary) 2. Follow renal function, cultures, clinical course.  Juliette Alcide, PharmD, BCPS.   Pager: [phone] 12/18/2013  10:20 AM

## 2013-12-18 NOTE — Progress Notes (Signed)
[id number]

## 2013-12-18 NOTE — Progress Notes (Signed)
PULMONARY / CRITICAL CARE MEDICINE   Name: Roger Fields MRN: [id number] DOB: 19-Oct-1955    ADMISSION DATE:  12/15/2013 CONSULTATION DATE: 12/15/13  CHIEF COMPLAINT:  Shortness of breath  BRIEF PATIENT DESCRIPTION:   58 y.o. Male smoker with h/o COPD, asthma and htn who presented 7/16 with progressive SOB.  He was intubated in the ED d/t respiratory distress. PCCM consulted.  Followed by Dr. Sherene Sires.  SIGNIFICANT EVENTS: 7/16 Admitted for acute respiratory failure 7/19 Transfer to medical floor bed  STUDIES: 7/16 echo: EF 65-70%, gd I D dysfxn, mod LVH, LA mildly dilated.   LINES / TUBES: OETT 7/16 >>> 7/18  CULTURES: Blood culture 7/16 >>> Sputum 7/16 >>>  ANTIBIOTICS: Levaquin 7/16 >>>  SUBJECTIVE:  Feels better.  Doesn't remember what happened before he came to hospital.  Still has cough and clear sputum.  Denies wheeze or chest pain.  VITAL SIGNS: Temp:  [97.6 F (36.4 C)-98.7 F (37.1 C)] 97.6 F (36.4 C) (07/19 0800) Pulse Rate:  [45-95] 45 (07/19 0600) Resp:  [12-22] 15 (07/19 0600) BP: (113-141)/(49-72) 130/67 mmHg (07/19 0600) SpO2:  [85 %-99 %] 98 % (07/19 0814) FiO2 (%):  [35 %] 35 % (07/18 1414) Weight:  [224 lb 3.3 oz (101.7 kg)] 224 lb 3.3 oz (101.7 kg) (07/19 0400) INTAKE / OUTPUT: Intake/Output     07/18 0701 - 07/19 0700 07/19 0701 - 07/20 0700   P.O. 930    I.V. (mL/kg) 245.3 (2.4)    IV Piggyback 100    Total Intake(mL/kg) 1275.3 (12.5)    Urine (mL/kg/hr) 1200 (0.5) 575 (3.8)   Emesis/NG output 125 (0.1)    Total Output 1325 575   Net -49.7 -575         PHYSICAL EXAMINATION: General: no distress Neuro: alert, follows commands, normal strength HEENT: poor dentition Cardiovascular: regular, no murmur Lungs: no wheeze Abdomen: Soft, non-tender Musculoskeletal: no edema Skin: no rashes  LABS:  CBC  Recent Labs Lab 12/16/13 0316 12/17/13 0339 12/18/13 0358  WBC 10.3 11.4* 11.5*  HGB 12.0* 12.6* 11.7*  HCT 35.5* 37.6* 35.1*   PLT 267 279 283   BMET  Recent Labs Lab 12/16/13 0316 12/17/13 0339 12/18/13 0358  NA 137 138 140  K 4.4 5.0 4.5  CL 103 101 102  CO2 23 25 28   BUN 18 34* 29*  CREATININE 1.16 1.15 0.87  GLUCOSE 149* 157* 128*   Electrolytes  Recent Labs Lab 12/15/13 0826 12/15/13 1117 12/16/13 0316 12/17/13 0339 12/18/13 0358  CALCIUM 9.1  --  8.8 9.1 8.8  MG  --  2.0 2.5  --   --   PHOS  --  3.4 3.7  --   --    ABG  Recent Labs Lab 12/15/13 1615 12/16/13 0400 12/17/13 0415  PHART 7.396 7.347* 7.373  PCO2ART 37.5 43.7 44.1  PO2ART 73.2* 82.8 102.0*   Liver Enzymes  Recent Labs Lab 12/16/13 0316 12/17/13 0339  AST 11 10  ALT 9 8  ALKPHOS 77 71  BILITOT 0.2* 0.3  ALBUMIN 3.3* 3.4*   Cardiac Enzymes  Recent Labs Lab 12/15/13 1117 12/15/13 1215 12/15/13 2258  TROPONINI <0.30 <0.30 <0.30  PROBNP 192.9*  --   --    Imaging Dg Chest Port 1 View  12/17/2013   CLINICAL DATA:  Evaluate endotracheal tube  EXAM: PORTABLE CHEST - 1 VIEW  COMPARISON:  Prior chest x-ray 12/16/2013  FINDINGS: The endotracheal tube remains in good position 6 cm above the carina. The  tip of the nasogastric tube lies off the field of view, below the diaphragm and presumably within the stomach. Slightly improved interstitial edema compared to the prior study with decreased discoid atelectasis in the left mid lung. Slightly increased bibasilar opacities favored to reflect areas of shifting atelectasis. Stable cardiac and mediastinal contours. No acute osseous abnormality.  IMPRESSION: 1. Stable and satisfactory support apparatus. 2. Interval resolution of mild interstitial edema. 3. Shifting atelectasis with decreased left mid lung atelectasis and increased bibasilar opacities favored to reflect atelectasis. Early infiltrate in the right lower lobe is difficult to exclude entirely.   Electronically Signed   By: Malachy MoanHeath  McCullough M.D.   On: 12/17/2013 07:04    ASSESSMENT / PLAN:  PULMONARY A:   Acute hypoxic/hypercapnic respiratory failure 2nd to AECOPD >> successfully extubated 7/18. Atelectasis. Tobacco abuse. P:    D/c solumedrol 7/19 and change to prednisone 20 mg >> wean off as tolerated Add dulera, spiriva 7/19 with prn albuterol Tobacco cessation F/u CXR intermittently Oxygen to keep SpO2 90 to 94% Bronchial hygiene  CARDIOVASCULAR A:  Hx of HTN with chronic diastolic heart failure. Tachycardia >> resolved >> likely from hypoxia. P:  PRN labetalol   RENAL A:   No acute issues. P:   Monitor renal fx, urine outpt, electrolytes  GASTROINTESTINAL A:   Nutrition. Hx of GERD. P:   Continue pepcid Regular diet  HEMATOLOGIC A:   No active issues P:  F/u CBC intermittently Lovenox VTE prophylaxis   INFECTIOUS A:  AECOPD. P:   Day 4/5 levaquin >> change to po on 7/19  ENDOCRINE A:  No active issues P:   F/u glucose on BMP  NEUROLOGIC A:   Acute encephalopathy related to acute respiratory failure >> resolved 7/19. Chronic pain. P:   PRN tylenol, fentanyl  Transfer to floor bed 7/19 >> if he does okay with transition to oral meds and inhalers, then likely ready for d/c home 7/20.  Coralyn HellingVineet Ghassan Coggeshall, MD [facility] Pulmonary/Critical Care 12/18/2013, 8:30 AM Pager:  [phone] After 3pm call: [phone]

## 2013-12-19 DIAGNOSIS — I10 Essential (primary) hypertension: Secondary | ICD-10-CM

## 2013-12-19 DIAGNOSIS — G8929 Other chronic pain: Secondary | ICD-10-CM

## 2013-12-19 DIAGNOSIS — M549 Dorsalgia, unspecified: Secondary | ICD-10-CM

## 2013-12-19 LAB — CULTURE, RESPIRATORY

## 2013-12-19 LAB — CULTURE, RESPIRATORY W GRAM STAIN: Special Requests: NORMAL

## 2013-12-19 MED ORDER — PREDNISONE 10 MG PO TABS: ORAL_TABLET | ORAL | Status: AC

## 2013-12-19 MED ORDER — MOMETASONE FURO-FORMOTEROL FUM 100-5 MCG/ACT IN AERO: 2.0000 | INHALATION_SPRAY | Freq: Two times a day (BID) | RESPIRATORY_TRACT | Status: AC

## 2013-12-19 MED ORDER — TIOTROPIUM BROMIDE MONOHYDRATE 18 MCG IN CAPS: 18.0000 ug | ORAL_CAPSULE | Freq: Every day | RESPIRATORY_TRACT | Status: AC

## 2013-12-19 MED ORDER — FAMOTIDINE 20 MG PO TABS: 20.0000 mg | ORAL_TABLET | Freq: Two times a day (BID) | ORAL | Status: AC

## 2013-12-19 NOTE — Progress Notes (Signed)
PULMONARY / CRITICAL CARE MEDICINE   Name: Roger Fields MRN: [id number] DOB: 1956/04/07    ADMISSION DATE:  12/15/2013 CONSULTATION DATE: 12/15/13  CHIEF COMPLAINT:  Shortness of breath  BRIEF PATIENT DESCRIPTION:   58 y.o. Male smoker with h/o COPD, asthma and htn who presented 7/16 with progressive SOB.  He was intubated in the ED d/t respiratory distress. PCCM consulted.  Followed by Dr. Sherene Sires.  SIGNIFICANT EVENTS: 7/16 Admitted for acute respiratory failure 7/19 Transfer to medical floor bed  STUDIES: 7/16 echo: EF 65-70%, gd I D dysfxn, mod LVH, LA mildly dilated.   LINES / TUBES: OETT 7/16 >>> 7/18  CULTURES: Blood culture 7/16 >>> Sputum 7/16 >>>  ANTIBIOTICS: Levaquin 7/16 >>>  SUBJECTIVE:  Anxious to go home.  Denies cough, wheeze, or chest pain.  VITAL SIGNS: Temp:  [97.5 F (36.4 C)-98.3 F (36.8 C)] 97.8 F (36.6 C) (07/20 0535) Pulse Rate:  [65-80] 66 (07/20 0535) Resp:  [14-20] 18 (07/20 0535) BP: (137-170)/(52-93) 147/77 mmHg (07/20 0535) SpO2:  [93 %-98 %] 98 % (07/20 0535) Weight:  [223 lb 9.6 oz (101.424 kg)] 223 lb 9.6 oz (101.424 kg) (07/19 1445) INTAKE / OUTPUT: Intake/Output     07/19 0701 - 07/20 0700 07/20 0701 - 07/21 0700   P.O. 880 360   I.V. (mL/kg)     IV Piggyback     Total Intake(mL/kg) 880 (8.7) 360 (3.5)   Urine (mL/kg/hr) 575 (0.2)    Emesis/NG output     Total Output 575     Net +305 +360        Urine Occurrence 4 x    Stool Occurrence 1 x     PHYSICAL EXAMINATION: General: no distress Neuro: alert, follows commands, normal strength HEENT: poor dentition Cardiovascular: regular, no murmur Lungs: no wheeze Abdomen: Soft, non-tender Musculoskeletal: no edema Skin: no rashes  LABS:  CBC  Recent Labs Lab 12/16/13 0316 12/17/13 0339 12/18/13 0358  WBC 10.3 11.4* 11.5*  HGB 12.0* 12.6* 11.7*  HCT 35.5* 37.6* 35.1*  PLT 267 279 283   BMET  Recent Labs Lab 12/16/13 0316 12/17/13 0339 12/18/13 0358   NA 137 138 140  K 4.4 5.0 4.5  CL 103 101 102  CO2 23 25 28   BUN 18 34* 29*  CREATININE 1.16 1.15 0.87  GLUCOSE 149* 157* 128*   Electrolytes  Recent Labs Lab 12/15/13 0826 12/15/13 1117 12/16/13 0316 12/17/13 0339 12/18/13 0358  CALCIUM 9.1  --  8.8 9.1 8.8  MG  --  2.0 2.5  --   --   PHOS  --  3.4 3.7  --   --    ABG  Recent Labs Lab 12/15/13 1615 12/16/13 0400 12/17/13 0415  PHART 7.396 7.347* 7.373  PCO2ART 37.5 43.7 44.1  PO2ART 73.2* 82.8 102.0*   Liver Enzymes  Recent Labs Lab 12/16/13 0316 12/17/13 0339  AST 11 10  ALT 9 8  ALKPHOS 77 71  BILITOT 0.2* 0.3  ALBUMIN 3.3* 3.4*   Cardiac Enzymes  Recent Labs Lab 12/15/13 1117 12/15/13 1215 12/15/13 2258  TROPONINI <0.30 <0.30 <0.30  PROBNP 192.9*  --   --    Imaging No results found.  ASSESSMENT / PLAN:  Acute hypoxic/hypercapnic respiratory failure 2nd to AECOPD >> successfully extubated 7/18. Atelectasis. Tobacco abuse. P:    Wean off prednisone as tolerated over next 5 days, currently on 20 mg per day Continue dulera, spiriva with prn albuterol Tobacco cessation Ambulate in hall to determine  whether he needs home oxygen set up Day 5/5 levaquin  Hx of HTN with chronic diastolic heart failure. Tachycardia >> resolved >> likely from hypoxia. P:  Re-assess as outpt  Hx of GERD. P:   Continue pepcid  Acute encephalopathy related to acute respiratory failure >> resolved 7/19. Chronic pain. P:   PRN tylenol, advil  Will need to assess home oxygen needs.  Otherwise he is ready for d/c home 7/20.  He will need f/u with Dr. Sherene SiresWert or Tammy Parrett within one week.  Coralyn HellingVineet Alfio Loescher, MD [facility] Pulmonary/Critical Care 12/19/2013, 10:14 AM Pager:  [phone] After 3pm call: [phone]

## 2013-12-19 NOTE — Progress Notes (Signed)
Patient O2 sat while ambulating at 93% on room air.

## 2013-12-19 NOTE — Progress Notes (Signed)
Nursing Discharge Summary  Patient ID: Nadean Corwinhillip Lynn Cosper MRN: [id number] DOB/AGE: [birth year] 58 y.o.  Admit date: 12/15/2013 Discharge date: 12/19/2013  Discharged Condition: good  Disposition: 01-Home or Self Care    Prescriptions Given: Pepcid; Others called in  Means of Discharge: Home with wife via car.  Signed: Starr Sinclairracy, Desmon Hitchner Elizabeth 12/19/2013, 11:49 AM

## 2013-12-19 NOTE — Discharge Summary (Signed)
Physician Discharge Summary       Patient ID: Roger Fields MRN: [id number] DOB/AGE: [birth year] 58 y.o.  Admit date: 12/15/2013 Discharge date: 12/19/2013  Discharge Diagnoses:  Acute hypoxic/hypercapnic respiratory failure 2nd to AECOPD  Atelectasis  HTN  Grade I diastolic dysfunction  GERD Acute encephalopathy Detailed Hospital Course:   58 y.o. Male with h/o COPD, asthma and htn who presented to [facility]ED on 7/16 with SOB x1 day. He was intubated in the ED d/t respiratory distress. PCCM consulted. Of note, he saw Dr. Sherene SiresWert 06/2013 for COPD but did not follow up. No PFT's done. Had c/o worsening SOB over 1 year. He is also a current smoker, and does not have oxygen at home. Was admitted to the ICU. Treated in the usual manor with mechanical ventilation, scheduled bronchodilators, systemic steroids, and empiric antibiotics. He required IV sedation during his acute phase which was supported with precedex infusion. His respiratory mechanic improved to the point he was able to be extubated as of 7/18. The remainder of his hospital course was focused on stepping down therapy. His sputum grew normal flora. His antibiotics were narrowed. Walking oximetry was checked prior to d/c. His oxygen level was 93% on room air with ambulation. He was ready for discharge as of 7/20 with the plan as outlined below.     Discharge Plan by diagnoses   Acute hypoxic/hypercapnic respiratory failure 2nd to AECOPD >> successfully extubated 7/18.  Atelectasis.  Tobacco abuse.  Plan:  Wean prednisone to off over next 5 days Added dulera, spiriva 7/19 with prn albuterol-->home on this  Tobacco cessation  Home on room air  Day 5/5 abx  ROV July 27  @ 1130 w/ Wert  Hx of HTN with chronic diastolic heart failure.  Plan:  Re-assess as out-pt. Have encouraged him to see his PCP   Hx of GERD.  Plan:  Continue pepcid   Acute encephalopathy related to acute respiratory failure >> resolved 7/19.  Chronic pain.   Plan:  PRN tylenol and advil     Significant Hospital tests/ studies/ interventions and procedures  SIGNIFICANT EVENTS:  7/16 Admitted for acute respiratory failure  7/19 Transfer to medical floor bed   STUDIES:  7/16 echo: EF 65-70%, gd I D dysfxn, mod LVH, LA mildly dilated.   LINES / TUBES:  OETT 7/16 >>> 7/18  CULTURES:  Blood culture 7/16 >>>  Sputum 7/16: mod GPC pairs, prelim NOF>>   Discharge Exam: BP 147/77  Pulse 66  Temp(Src) 97.8 F (36.6 C) (Oral)  Resp 18  Ht 6' (1.829 m)  Wt 101.424 kg (223 lb 9.6 oz)  BMI 30.32 kg/m2  SpO2 98%  General: no distress  Neuro: alert, follows commands, normal strength  HEENT: poor dentition  Cardiovascular: regular, no murmur  Lungs: no wheeze  Abdomen: Soft, non-tender  Musculoskeletal: no edema  Skin: no rashes   Labs at discharge Lab Results  Component Value Date   CREATININE 0.87 12/18/2013   BUN 29* 12/18/2013   NA 140 12/18/2013   K 4.5 12/18/2013   CL 102 12/18/2013   CO2 28 12/18/2013   Lab Results  Component Value Date   WBC 11.5* 12/18/2013   HGB 11.7* 12/18/2013   HCT 35.1* 12/18/2013   MCV 90.7 12/18/2013   PLT 283 12/18/2013   Lab Results  Component Value Date   ALT 8 12/17/2013   AST 10 12/17/2013   ALKPHOS 71 12/17/2013   BILITOT 0.3 12/17/2013   No results found for  this basename: INR,  PROTIME    Current radiology studies No results found.  Disposition:  01-Home or Self Care      Discharge Instructions   Diet - low sodium heart healthy    Complete by:  As directed      Increase activity slowly    Complete by:  As directed             Medication List    STOP taking these medications       budesonide-formoterol 160-4.5 MCG/ACT inhaler  Commonly known as:  SYMBICORT      TAKE these medications       albuterol 108 (90 BASE) MCG/ACT inhaler  Commonly known as:  PROVENTIL HFA;VENTOLIN HFA  Inhale 1-2 puffs into the lungs every 6 (six) hours as needed for wheezing or shortness  of breath.     albuterol (2.5 MG/3ML) 0.083% nebulizer solution  Commonly known as:  PROVENTIL  Take 2.5 mg by nebulization every 6 (six) hours as needed for wheezing or shortness of breath.     famotidine 20 MG tablet  Commonly known as:  PEPCID  Take 1 tablet (20 mg total) by mouth 2 (two) times daily.     ibuprofen 200 MG tablet  Commonly known as:  ADVIL,MOTRIN  Take 400 mg by mouth every 6 (six) hours as needed for pain.     mometasone-formoterol 100-5 MCG/ACT Aero  Commonly known as:  DULERA  Inhale 2 puffs into the lungs 2 (two) times daily.     predniSONE 10 MG tablet  Commonly known as:  DELTASONE  2 tabs daily for 2 days, then 1 tab daily for 3 days and stop     tiotropium 18 MCG inhalation capsule  Commonly known as:  SPIRIVA  Place 1 capsule (18 mcg total) into inhaler and inhale daily.         Discharged Condition: fair Ambulates on room air with sats > 93%  Physician Statement:   The Patient was personally examined, the discharge assessment and plan has been personally reviewed and I agree with ACNP Babcock's assessment and plan. > 30 minutes of time have been dedicated to discharge assessment, planning and discharge instructions.   SignedAnders Simmonds 12/19/2013, 11:46 AM   Coralyn Helling, MD [place] [facility] Pulmonary/Critical Care 12/19/2013, 12:02 PM Pager:  [phone] After 3pm call: [phone]

## 2013-12-21 LAB — CULTURE, BLOOD (ROUTINE X 2)
Culture: NO GROWTH
Culture: NO GROWTH

## 2013-12-26 ENCOUNTER — Inpatient Hospital Stay: Payer: PRIVATE HEALTH INSURANCE | Admitting: Internal Medicine

## 2016-02-04 IMAGING — DX DG CHEST 1V PORT
1 series · 2 of 2 positions shown · non-contrast
Comparison: Single view of the chest 12/15/2013. CT chest
03/11/2013.

CLINICAL DATA: Shortness of breath.  COPD.

EXAM:
PORTABLE CHEST - 1 VIEW

[Series 1: chest ap · 0.14mm/px · 2 of 2 slices shown]
[im 1/2]
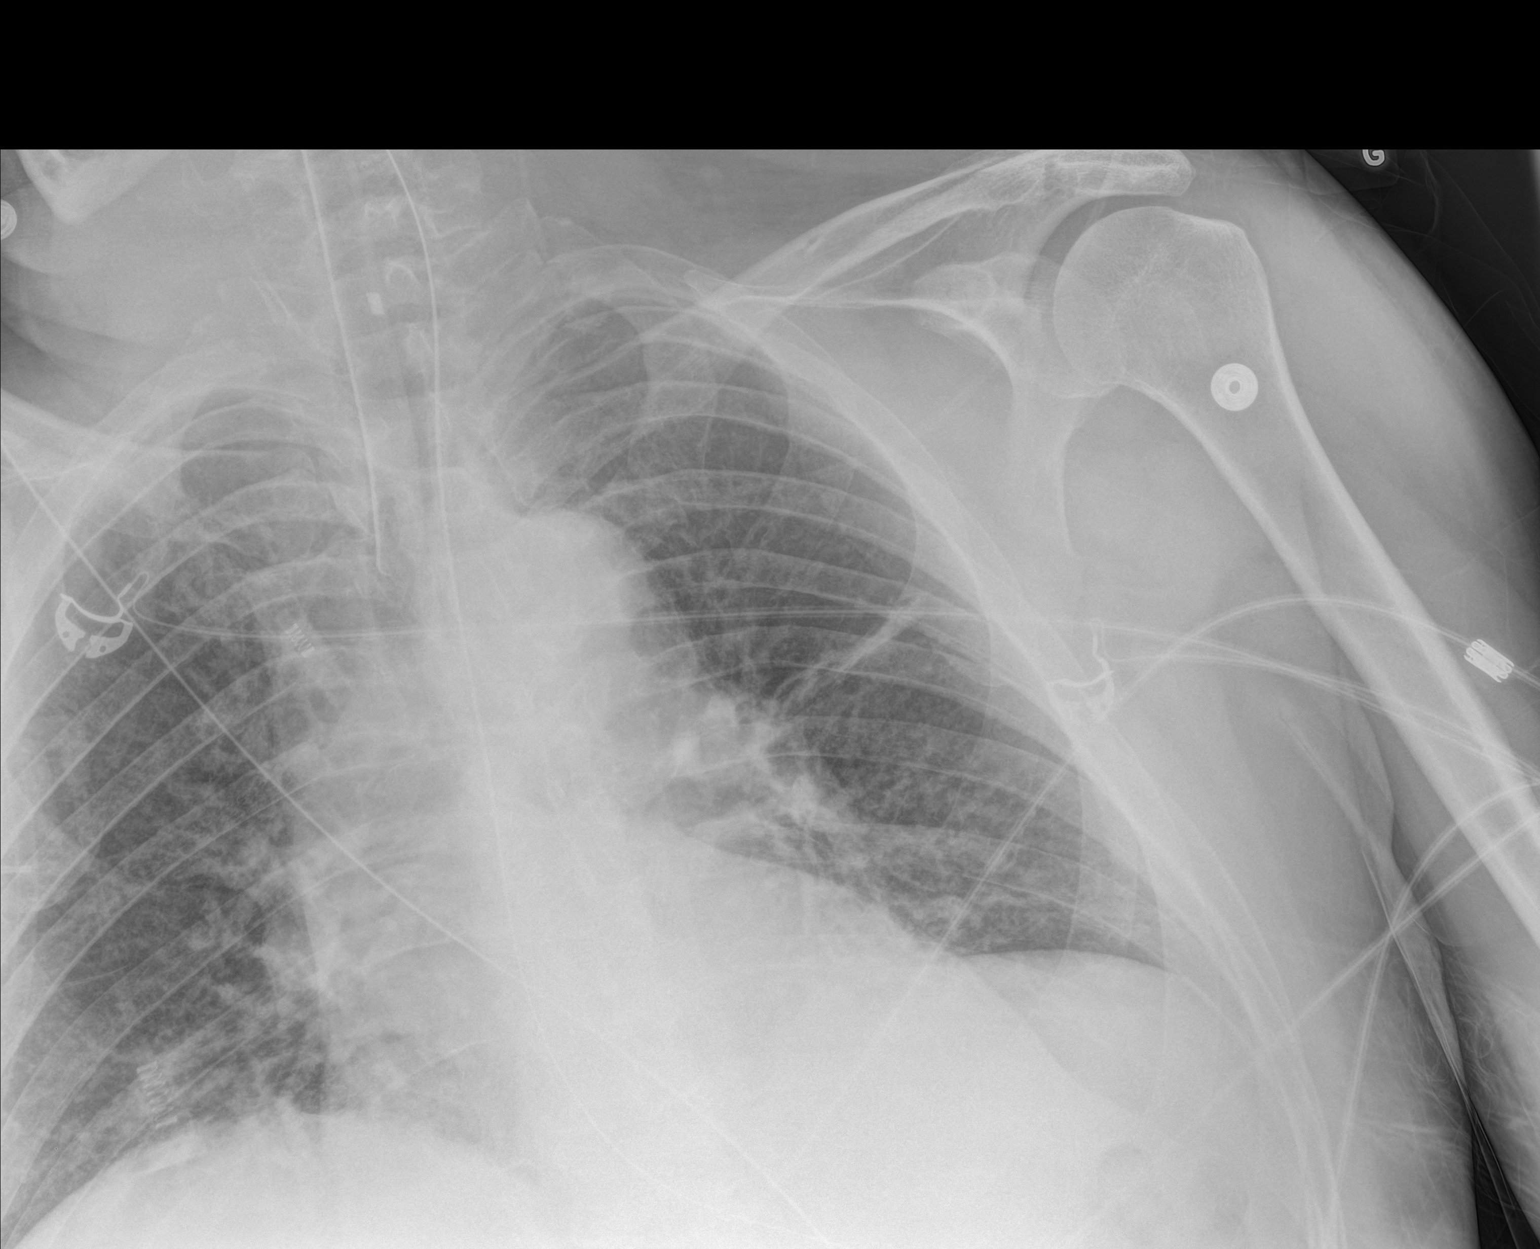
[im 2/2]
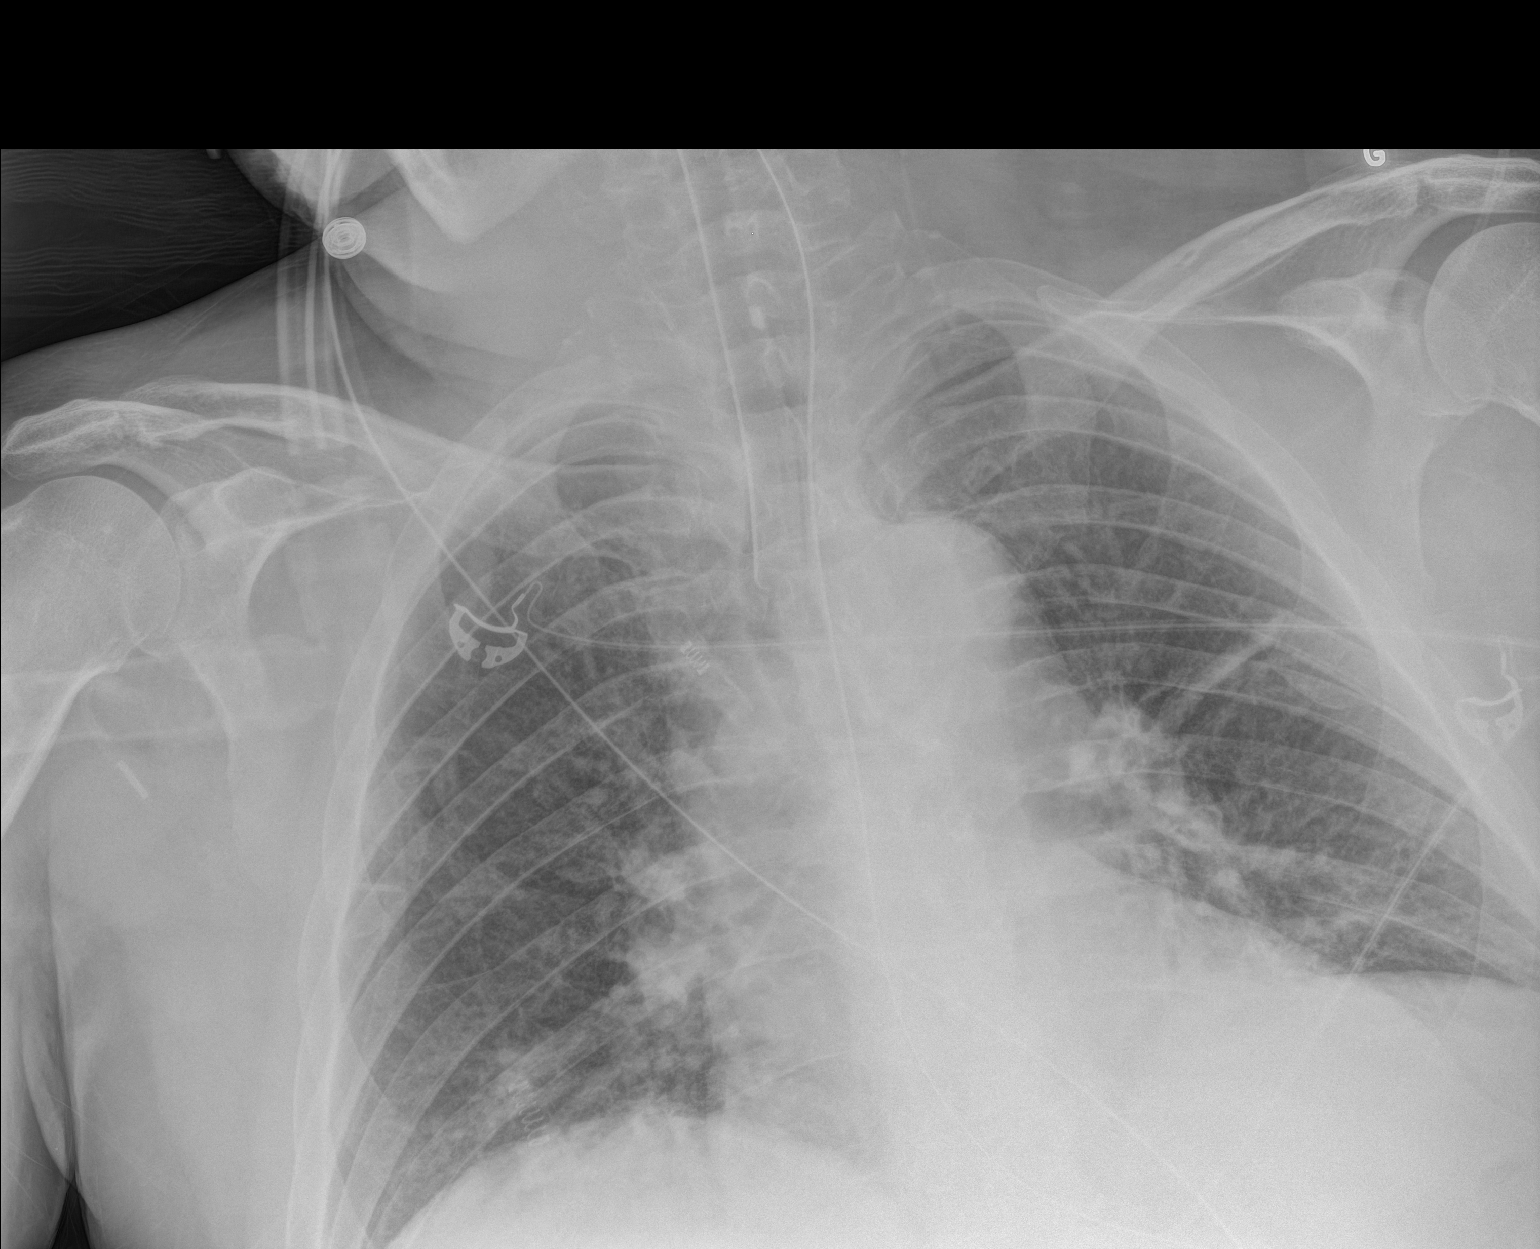

[2 of 2 positions shown; findings below may reference images not displayed]

FINDINGS: FINDINGS

The the patient has an NG tube coursing into the stomach and below
the inferior margin of the film. ET tube is in place with the tip in
good position at the level of the clavicular heads. Minimal discoid
atelectasis is seen in the left mid lung zone. Lungs otherwise clear
Heart size is normal. No pneumothorax or pleural effusion.
IMPRESSION: Support apparatus projects in good position.

Minimal discoid atelectasis left midlung zone.

## 2016-02-05 IMAGING — CR DG CHEST 1V PORT
1 series · 1 of 1 positions shown · non-contrast
Comparison: Prior chest x-ray 12/16/2013

CLINICAL DATA: Evaluate endotracheal tube

EXAM:
PORTABLE CHEST - 1 VIEW

[AP]
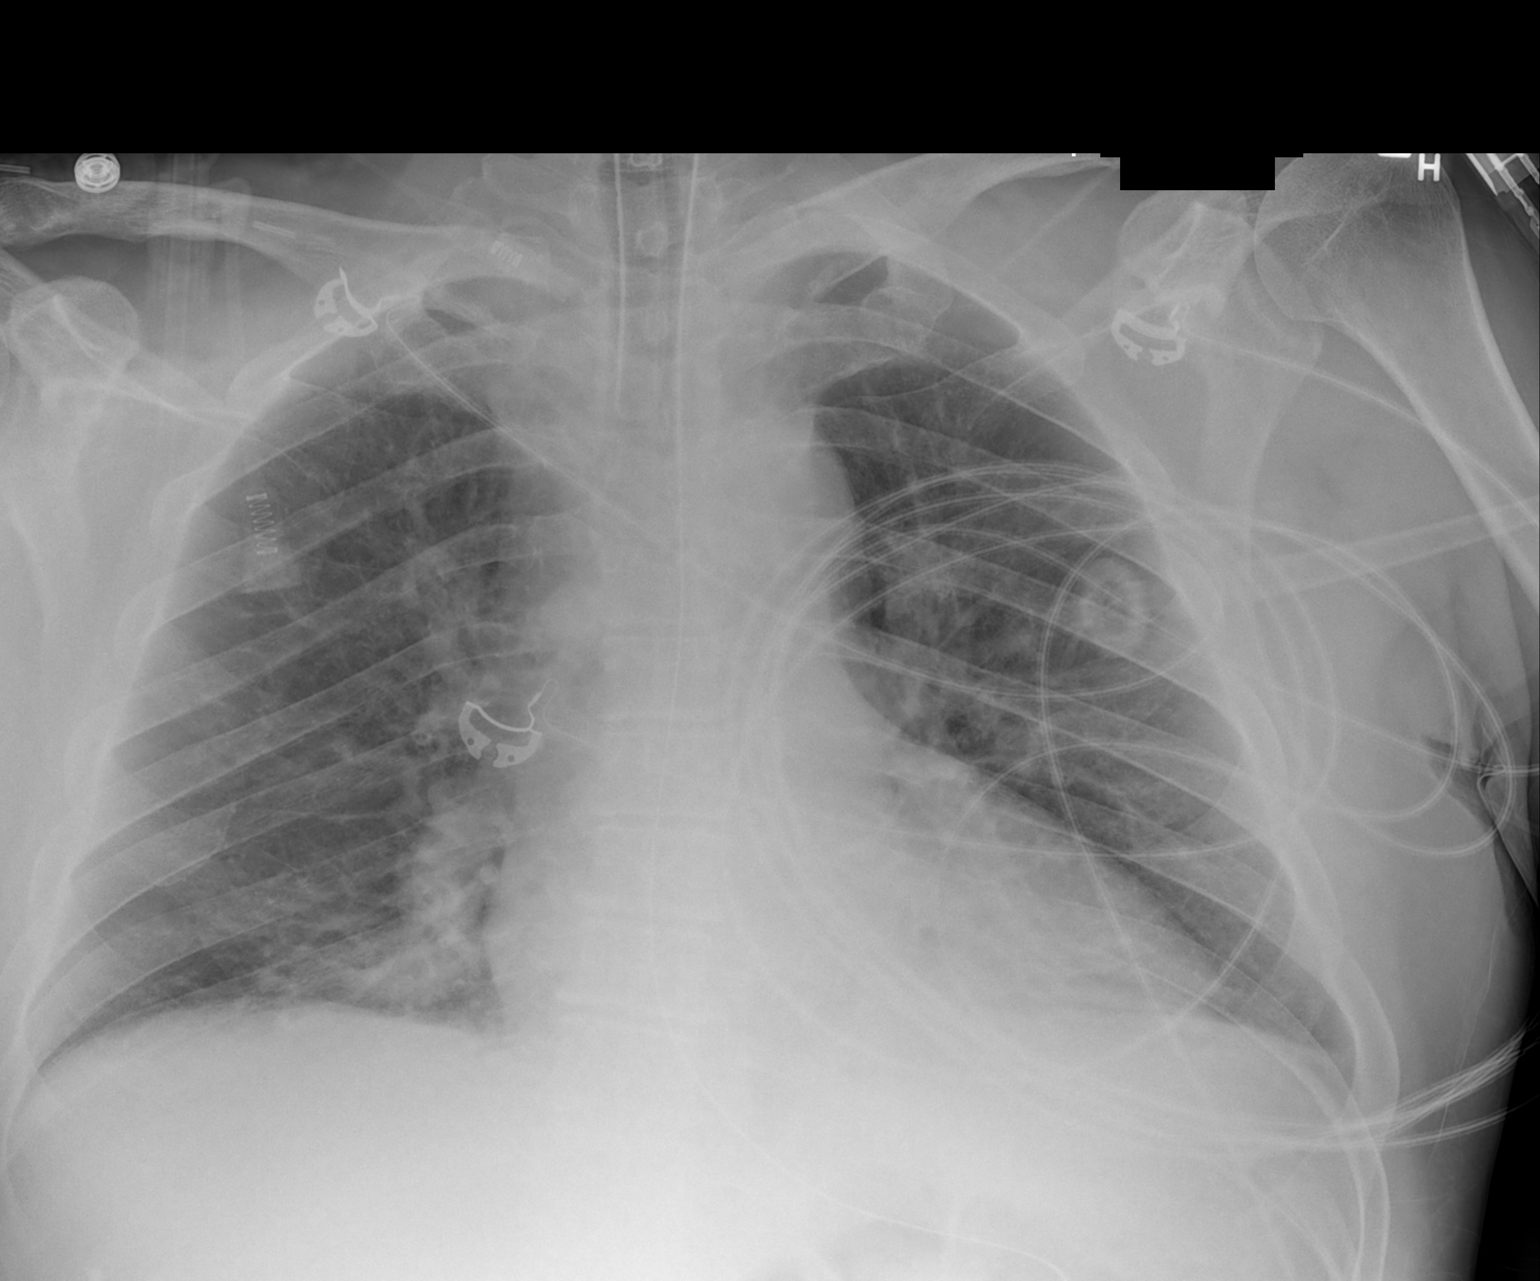

[1 of 1 positions shown; findings below may reference images not displayed]

FINDINGS: The endotracheal tube remains in good position 6 cm above the
carina. The tip of the nasogastric tube lies off the field of view,
below the diaphragm and presumably within the stomach. Slightly
improved interstitial edema compared to the prior study with
decreased discoid atelectasis in the left mid lung. Slightly
increased bibasilar opacities favored to reflect areas of shifting
atelectasis. Stable cardiac and mediastinal contours. No acute
osseous abnormality.
IMPRESSION: 1. Stable and satisfactory support apparatus.
2. Interval resolution of mild interstitial edema.
3. Shifting atelectasis with decreased left mid lung atelectasis and
increased bibasilar opacities favored to reflect atelectasis. Early
infiltrate in the right lower lobe is difficult to exclude entirely.

## 2021-11-30 DEATH — deceased
